# Patient Record
Sex: Female | Born: 1980 | Race: White | Hispanic: No | Marital: Married | Smoking: Never smoker
Health system: Southern US, Community
[De-identification: ages and names within clinical notes are randomized; demographics above are authoritative.]

## PROBLEM LIST (undated history)

## (undated) DIAGNOSIS — T8859XA Other complications of anesthesia, initial encounter: Secondary | ICD-10-CM

## (undated) DIAGNOSIS — T4145XA Adverse effect of unspecified anesthetic, initial encounter: Secondary | ICD-10-CM

## (undated) DIAGNOSIS — R7303 Prediabetes: Secondary | ICD-10-CM

## (undated) DIAGNOSIS — M549 Dorsalgia, unspecified: Secondary | ICD-10-CM

## (undated) DIAGNOSIS — F32A Depression, unspecified: Secondary | ICD-10-CM

## (undated) DIAGNOSIS — F329 Major depressive disorder, single episode, unspecified: Secondary | ICD-10-CM

## (undated) DIAGNOSIS — K760 Fatty (change of) liver, not elsewhere classified: Secondary | ICD-10-CM

## (undated) DIAGNOSIS — L409 Psoriasis, unspecified: Secondary | ICD-10-CM

## (undated) DIAGNOSIS — K5900 Constipation, unspecified: Secondary | ICD-10-CM

## (undated) DIAGNOSIS — F419 Anxiety disorder, unspecified: Secondary | ICD-10-CM

## (undated) HISTORY — DX: Constipation, unspecified: K59.00

## (undated) HISTORY — DX: Dorsalgia, unspecified: M54.9

## (undated) HISTORY — DX: Prediabetes: R73.03

## (undated) HISTORY — PX: WISDOM TOOTH EXTRACTION: SHX21

## (undated) HISTORY — DX: Psoriasis, unspecified: L40.9

## (undated) HISTORY — DX: Fatty (change of) liver, not elsewhere classified: K76.0

## (undated) HISTORY — DX: Anxiety disorder, unspecified: F41.9

---
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
## 2004-05-13 ENCOUNTER — Other Ambulatory Visit: Admission: RE | Admit: 2004-05-13 | Discharge: 2004-05-13 | Payer: Self-pay | Admitting: Emergency Medicine

## 2005-06-06 ENCOUNTER — Other Ambulatory Visit: Admission: RE | Admit: 2005-06-06 | Discharge: 2005-06-06 | Payer: Self-pay | Admitting: Family Medicine

## 2006-06-28 ENCOUNTER — Other Ambulatory Visit: Admission: RE | Admit: 2006-06-28 | Discharge: 2006-06-28 | Payer: Self-pay | Admitting: Family Medicine

## 2006-09-04 ENCOUNTER — Encounter: Admission: RE | Admit: 2006-09-04 | Discharge: 2006-09-04 | Payer: Self-pay | Admitting: Emergency Medicine

## 2007-09-03 ENCOUNTER — Other Ambulatory Visit: Admission: RE | Admit: 2007-09-03 | Discharge: 2007-09-03 | Payer: Self-pay | Admitting: Family Medicine

## 2009-01-28 ENCOUNTER — Ambulatory Visit (HOSPITAL_COMMUNITY): Admission: RE | Admit: 2009-01-28 | Discharge: 2009-01-28 | Payer: Self-pay | Admitting: Obstetrics & Gynecology

## 2009-02-25 ENCOUNTER — Ambulatory Visit (HOSPITAL_COMMUNITY): Admission: RE | Admit: 2009-02-25 | Discharge: 2009-02-25 | Payer: Self-pay | Admitting: Obstetrics & Gynecology

## 2009-05-10 ENCOUNTER — Ambulatory Visit (HOSPITAL_COMMUNITY): Admission: RE | Admit: 2009-05-10 | Discharge: 2009-05-10 | Payer: Self-pay | Admitting: Obstetrics and Gynecology

## 2009-05-23 ENCOUNTER — Inpatient Hospital Stay (HOSPITAL_COMMUNITY): Admission: AD | Admit: 2009-05-23 | Discharge: 2009-05-23 | Payer: Self-pay | Admitting: Obstetrics & Gynecology

## 2009-05-29 ENCOUNTER — Inpatient Hospital Stay (HOSPITAL_COMMUNITY): Admission: AD | Admit: 2009-05-29 | Discharge: 2009-05-29 | Payer: Self-pay | Admitting: Obstetrics and Gynecology

## 2009-06-09 ENCOUNTER — Inpatient Hospital Stay (HOSPITAL_COMMUNITY): Admission: RE | Admit: 2009-06-09 | Discharge: 2009-06-12 | Payer: Self-pay | Admitting: Obstetrics and Gynecology

## 2009-12-26 ENCOUNTER — Emergency Department (HOSPITAL_COMMUNITY): Admission: EM | Admit: 2009-12-26 | Discharge: 2009-12-26 | Payer: Self-pay | Admitting: Emergency Medicine

## 2010-03-04 ENCOUNTER — Emergency Department (HOSPITAL_COMMUNITY)
Admission: EM | Admit: 2010-03-04 | Discharge: 2010-03-04 | Payer: Self-pay | Source: Home / Self Care | Admitting: Emergency Medicine

## 2010-07-20 LAB — URINALYSIS, ROUTINE W REFLEX MICROSCOPIC
Bilirubin Urine: NEGATIVE
Ketones, ur: 15 mg/dL — AB
Nitrite: NEGATIVE
Urobilinogen, UA: 0.2 mg/dL (ref 0.0–1.0)
pH: 5.5 (ref 5.0–8.0)

## 2010-07-20 LAB — DIFFERENTIAL
Eosinophils Absolute: 0.2 10*3/uL (ref 0.0–0.7)
Eosinophils Relative: 2 % (ref 0–5)
Lymphocytes Relative: 6 % — ABNORMAL LOW (ref 12–46)
Lymphs Abs: 1 10*3/uL (ref 0.7–4.0)
Monocytes Absolute: 1 10*3/uL (ref 0.1–1.0)
Monocytes Relative: 7 % (ref 3–12)

## 2010-07-20 LAB — CBC
Hemoglobin: 12.8 g/dL (ref 12.0–15.0)
MCH: 28.9 pg (ref 26.0–34.0)
MCHC: 33.7 g/dL (ref 30.0–36.0)
Platelets: 220 10*3/uL (ref 150–400)
RDW: 12.5 % (ref 11.5–15.5)

## 2010-07-20 LAB — POCT CARDIAC MARKERS
CKMB, poc: <1 ng/mL — ABNORMAL LOW (ref 1.0–8.0)
CKMB, poc: <1 ng/mL — ABNORMAL LOW (ref 1.0–8.0)
Myoglobin, poc: 37.7 ng/mL (ref 12–200)
Myoglobin, poc: 44.7 ng/mL (ref 12–200)
Troponin i, poc: <0.05 ng/mL (ref 0.00–0.09)

## 2010-07-20 LAB — COMPREHENSIVE METABOLIC PANEL
ALT: 122 U/L — ABNORMAL HIGH (ref 0–35)
AST: 187 U/L — ABNORMAL HIGH (ref 0–37)
Albumin: 3.9 g/dL (ref 3.5–5.2)
CO2: 24 mEq/L (ref 19–32)
Calcium: 8.9 mg/dL (ref 8.4–10.5)
Creatinine, Ser: 0.58 mg/dL (ref 0.4–1.2)
GFR calc Af Amer: >60 mL/min (ref >60)
GFR calc non Af Amer: >60 mL/min (ref >60)
Sodium: 136 mEq/L (ref 135–145)
Total Protein: 7.7 g/dL (ref 6.0–8.3)

## 2010-07-20 LAB — LIPASE, BLOOD: Lipase: 23 U/L (ref 11–59)

## 2010-07-22 LAB — COMPREHENSIVE METABOLIC PANEL
BUN: 12 mg/dL (ref 6–23)
CO2: 25 mEq/L (ref 19–32)
Calcium: 8.3 mg/dL — ABNORMAL LOW (ref 8.4–10.5)
Chloride: 110 mEq/L (ref 96–112)
Creatinine, Ser: 0.58 mg/dL (ref 0.4–1.2)
GFR calc non Af Amer: >60 mL/min (ref >60)
Total Bilirubin: 0.6 mg/dL (ref 0.3–1.2)

## 2010-07-22 LAB — DIFFERENTIAL
Basophils Absolute: 0 10*3/uL (ref 0.0–0.1)
Lymphocytes Relative: 12 % (ref 12–46)
Lymphs Abs: 2.1 10*3/uL (ref 0.7–4.0)
Neutro Abs: 14 10*3/uL — ABNORMAL HIGH (ref 1.7–7.7)

## 2010-07-22 LAB — CBC
MCH: 28.9 pg (ref 26.0–34.0)
MCHC: 33.9 g/dL (ref 30.0–36.0)
MCV: 85.1 fL (ref 78.0–100.0)
Platelets: 213 10*3/uL (ref 150–400)
RBC: 3.95 MIL/uL (ref 3.87–5.11)

## 2010-07-22 LAB — POCT CARDIAC MARKERS
CKMB, poc: <1 ng/mL — ABNORMAL LOW (ref 1.0–8.0)
Myoglobin, poc: 9.8 ng/mL — ABNORMAL LOW (ref 12–200)

## 2010-07-22 LAB — LIPASE, BLOOD: Lipase: 24 U/L (ref 11–59)

## 2010-07-27 LAB — CBC
HCT: 29.7 % — ABNORMAL LOW (ref 36.0–46.0)
MCHC: 34 g/dL (ref 30.0–36.0)
MCHC: 34.3 g/dL (ref 30.0–36.0)
MCV: 89.7 fL (ref 78.0–100.0)
Platelets: 228 10*3/uL (ref 150–400)
RBC: 3.31 MIL/uL — ABNORMAL LOW (ref 3.87–5.11)
RDW: 13.4 % (ref 11.5–15.5)
WBC: 14.4 10*3/uL — ABNORMAL HIGH (ref 4.0–10.5)

## 2010-07-27 LAB — TYPE AND SCREEN: ABO/RH(D): O POS

## 2010-07-27 LAB — RPR: RPR Ser Ql: NONREACTIVE

## 2011-06-15 ENCOUNTER — Other Ambulatory Visit: Payer: Self-pay | Admitting: Family Medicine

## 2011-06-15 ENCOUNTER — Ambulatory Visit (INDEPENDENT_AMBULATORY_CARE_PROVIDER_SITE_OTHER): Payer: BC Managed Care – PPO | Admitting: Family Medicine

## 2011-06-15 DIAGNOSIS — R509 Fever, unspecified: Secondary | ICD-10-CM

## 2011-06-15 DIAGNOSIS — J029 Acute pharyngitis, unspecified: Secondary | ICD-10-CM

## 2011-06-15 LAB — POCT CBC
Granulocyte percent: 83.4 %G — AB (ref 37–80)
MID (cbc): 0.8 (ref 0–0.9)
MPV: 8.7 fL (ref 0–99.8)
POC Granulocyte: 15.7 — AB (ref 2–6.9)
POC LYMPH PERCENT: 12.1 %L (ref 10–50)
POC MID %: 4.5 %M (ref 0–12)
Platelet Count, POC: 284 10*3/uL (ref 142–424)
RBC: 4.65 M/uL (ref 4.04–5.48)
RDW, POC: 12.4 %

## 2011-06-15 MED ORDER — CEFDINIR 300 MG PO CAPS
300.0000 mg | ORAL_CAPSULE | Freq: Two times a day (BID) | ORAL | Status: AC
Start: 2011-06-15 — End: 2011-06-25

## 2011-06-15 MED ORDER — ACETAMINOPHEN 325 MG PO TABS
1000.0000 mg | ORAL_TABLET | Freq: Once | ORAL | Status: AC
  Administered 2011-06-15: 975 mg via ORAL

## 2011-06-15 NOTE — Progress Notes (Signed)
Patient Name: Tonya Olsen Date of Birth: Jan 08, 1981 Medical Record Number: [id number] Gender: female Date of Encounter: 06/15/2011  History of Present Illness:  Tonya Olsen is a 31 y.o. very pleasant female patient who presents with the following:  Severe sore throat which began yesterday.  Today had even worse ST, also URI symptoms, fever and chills.  No cough, and is able to eat and drink.  Has tried to push fluids and notes good urine output.   LMP- currently on menses.  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  Generally healthy.  Has felt dizzy and was concerned that she might be dehydrated.   There is no problem list on file for this patient.  No past medical history on file. No past surgical history on file. History  Substance Use Topics  . Smoking status: Never Smoker   . Smokeless tobacco: Not on file  . Alcohol Use: Not on file   No family history on file. No Known Allergies  Medication list has been reviewed and updated.  Review of Systems: As per HPI.  Notably negative for any GI symptoms or urinary symptoms  Physical Examination: Filed Vitals:   06/15/11 1849  BP: 127/85  Pulse: 144  Temp: 100 F (37.8 C)  TempSrc: Oral  Resp: 20  Height: 5\' 2"  (1.575 m)  Weight: 181 lb (82.101 kg)    Body mass index is 33.11 kg/(m^2).  GEN: WDWN, NAD, Non-toxic, A & O x 3 HEENT: Atraumatic, Normocephalic. Neck supple. No masses, No LAD. Ears and Nose: No external deformity. Tm wnl bilaterally- tonsils are large but not red/ no exudate.   CV: tachycardic, No M/G/R. No JVD. No thrill. No extra heart sounds. PULM: CTA B, no wheezes, crackles, rhonchi. No retractions. No resp. distress. No accessory muscle use. ABD: S, NT, ND, +BS. No rebound. No HSM.  Patient does have complaint of chronic back problems which make it difficult for her to change position for abd exam.  EXTR: No c/c/e NEURO Normal gait.  PSYCH: Normally  interactive. Conversant. Not depressed or anxious appearing.  Calm demeanor.  Results for orders placed in visit on 06/15/11  POCT RAPID STREP A (OFFICE)      Component Value Range   Rapid Strep A Screen Negative  Negative   POCT CBC      Component Value Range   WBC 18.8 (*) 4.6 - 10.2 (K/uL)   Lymph, poc 2.3  0.6 - 3.4    POC LYMPH PERCENT 12.1  10 - 50 (%L)   MID (cbc) 0.8  0 - 0.9    POC MID % 4.5  0 - 12 (%M)   POC Granulocyte 15.7 (*) 2 - 6.9    Granulocyte percent 83.4 (*) 37 - 80 (%G)   RBC 4.65  4.04 - 5.48 (M/uL)   Hemoglobin 13.0  12.2 - 16.2 (g/dL)   HCT, POC 96.2  95.2 - 47.9 (%)   MCV 86.1  80 - 97 (fL)   MCH, POC 28.0  27 - 31.2 (pg)   MCHC 32.5  31.8 - 35.4 (g/dL)   RDW, POC 84.1     Platelet Count, POC 284  142 - 424 (K/uL)   MPV 8.7  0 - 99.8 (fL)   Patient felt better and pulse came down to within normal limits with IVF.  Measured at 96 BPM.  Temp to 97.8.  Patient took 1 and 2/3 bags of normal  saline and felt better, dizziness resolved.  She desired to go home.   Assessment and Plan: 1. Fever, unspecified  acetaminophen (TYLENOL) tablet 975 mg, POCT CBC  2. Sore throat  POCT rapid strep A, cefdinir (OMNICEF) 300 MG capsule   Pharyngitis and dehydration.  Treated with IVF and antipyretics as above.  Called omnief to phamacy in case of bacterial origin of sore throat.  Urged lots of fluids, bland diet, antipyretics.  Patient (or parent if minor) instructed to return to clinic or call if not better in 2 day(s).Sooner if worse- go to ED if severe symptoms such as syncope or pre-syncope overnight.

## 2011-06-19 LAB — CULTURE, GROUP A STREP: Organism ID, Bacteria: NORMAL

## 2011-06-24 ENCOUNTER — Telehealth: Payer: Self-pay

## 2011-06-24 NOTE — Telephone Encounter (Signed)
LMOM to CB. 

## 2011-06-24 NOTE — Telephone Encounter (Signed)
Pt is no better - saw Dr. Patsy Lager few days ago  Call [phone]

## 2011-06-24 NOTE — Telephone Encounter (Signed)
However, if she is better except for PND we could call in some atrovent nasal spray

## 2011-06-24 NOTE — Telephone Encounter (Signed)
Tonya Olsen was quite ill last when we saw her last and had to have IV fluids!  If her throat is still not doing well she needs to please come in for a recheck.

## 2011-06-24 NOTE — Telephone Encounter (Signed)
Spoke with pt and she states her throat is more sore than last week and she is taking tylenol with no relief. She is now having body aches, no fever, and headache is gone. She thinks the PND is causing her throat to be so sore. What should I advise pt? [facility] [address].

## 2011-06-25 ENCOUNTER — Ambulatory Visit (INDEPENDENT_AMBULATORY_CARE_PROVIDER_SITE_OTHER): Payer: BC Managed Care – PPO | Admitting: Family Medicine

## 2011-06-25 ENCOUNTER — Encounter: Payer: Self-pay | Admitting: Physician Assistant

## 2011-06-25 VITALS — BP 100/66 | HR 89 | Temp 98.2°F | Resp 16 | Ht 62.0 in | Wt 183.0 lb

## 2011-06-25 DIAGNOSIS — R5383 Other fatigue: Secondary | ICD-10-CM

## 2011-06-25 DIAGNOSIS — D7289 Other specified disorders of white blood cells: Secondary | ICD-10-CM

## 2011-06-25 DIAGNOSIS — R5381 Other malaise: Secondary | ICD-10-CM

## 2011-06-25 DIAGNOSIS — J029 Acute pharyngitis, unspecified: Secondary | ICD-10-CM

## 2011-06-25 LAB — POCT CBC
Granulocyte percent: 63.6 %G (ref 37–80)
HCT, POC: 40 % (ref 37.7–47.9)
Hemoglobin: 12.9 g/dL (ref 12.2–16.2)
POC Granulocyte: 7.8 — AB (ref 2–6.9)
POC LYMPH PERCENT: 30.8 %L (ref 10–50)
RBC: 4.64 M/uL (ref 4.04–5.48)
RDW, POC: 12.3 %

## 2011-06-25 LAB — COMPREHENSIVE METABOLIC PANEL
AST: 22 U/L (ref 0–37)
Albumin: 4.7 g/dL (ref 3.5–5.2)
Alkaline Phosphatase: 84 U/L (ref 39–117)
Chloride: 102 mEq/L (ref 96–112)
Glucose, Bld: 82 mg/dL (ref 70–99)
Potassium: 4.5 mEq/L (ref 3.5–5.3)
Sodium: 138 mEq/L (ref 135–145)
Total Protein: 7.8 g/dL (ref 6.0–8.3)

## 2011-06-25 MED ORDER — DIPHENHYD-HYDROCORT-NYSTATIN MT SUSP: 5.0000 mL | Freq: Four times a day (QID) | OROMUCOSAL | Status: DC | PRN

## 2011-06-25 MED ORDER — PREDNISONE 20 MG PO TABS: ORAL_TABLET | ORAL | Status: AC

## 2011-06-25 NOTE — Telephone Encounter (Signed)
Spoke with pt advised to RTC. Pt will f/u today

## 2011-06-25 NOTE — Progress Notes (Signed)
Patient ID: Tonya Olsen MRN: [id number], DOB: 1980-07-17, 31 y.o. Date of Encounter: 06/25/2011, 11:14 AM  Primary Physician: No primary provider on file.  Chief Complaint:  Chief Complaint  Patient presents with  . Sinusitis  . Sore Throat    HPI: 31 y.o. year old female presents with continued sore throat from last OV on 06/15/11. At that visit she was found to have a WBC count of 18.8  And temp of 100.0,  treated with Omnicef for pharyngitis. She states this did help her sore throat to improve, until 3 days prior when her sore throat returned, although not as bad as initial presentation.  Currently she afebrile. No cough. No nasal congestion. Normal hearing. No sinus pressure. Decreased appetite secondary to sore throat. No GI symptoms.   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 Generally healthy.  No leg trauma, sedentary periods, h/o cancer, or tobacco use.  No past medical history on file.   Home Meds: Prior to Admission medications   Medication Sig Start Date End Date Taking? Authorizing Provider  cefdinir (OMNICEF) 300 MG capsule Take 1 capsule (300 mg total) by mouth 2 (two) times daily. 06/15/11 06/25/11  Abbe Amsterdam, MD    Allergies: No Known Allergies  History   Social History  . Marital Status: Married    Spouse Name: N/A    Number of Children: N/A  . Years of Education: N/A   Occupational History  . Not on file.   Social History Main Topics  . Smoking status: Never Smoker   . Smokeless tobacco: Not on file  . Alcohol Use: Not on file  . Drug Use: Not on file  . Sexually Active: Not on file   Other Topics Concern  . Not on file   Social History Narrative  . No narrative on file     Review of Systems: Constitutional: negative for chills, fever, night sweats or weight changes Cardiovascular: negative for chest pain or palpitations Respiratory: negative for hemoptysis, wheezing, or shortness of breath Abdominal:  negative for abdominal pain, nausea, vomiting or diarrhea Dermatological: negative for rash Neurologic: negative for headache   Physical Exam: Blood pressure 100/66, pulse 89, temperature 98.2 F (36.8 C), temperature source Oral, resp. rate 16, height 5\' 2"  (1.575 m), weight 183 lb (83.008 kg), last menstrual period 06/11/2011., Body mass index is 33.47 kg/(m^2). General: Well developed, well nourished, in no acute distress. Head: Normocephalic, atraumatic, eyes without discharge, sclera non-icteric, nares are congested. Bilateral auditory canals clear, TM's are without perforation, pearly grey with reflective cone of light bilaterally. Oral cavity moist, dentition normal. Posterior pharynx with post nasal drip and mild erythema. Bilateral tonsil 3+ (patient states is baseline for her). No peritonsillar abscess or tonsillar exudate. Neck: Supple. No thyromegaly. Full ROM. <2 cm AC Lungs: Clear bilaterally to auscultation without wheezes, rales, or rhonchi. Breathing is unlabored. Heart: RRR with S1 S2. No murmurs, rubs, or gallops appreciated. Abdomen: Soft, non-tender, non-distended with normoactive bowel sounds. No hepatosplenomegaly. No rebound/guarding. No obvious abdominal masses. Msk:  Strength and tone normal for age. Extremities: No clubbing or cyanosis. No edema. Neuro: Alert and oriented X 3. Moves all extremities spontaneously. CNII-XII grossly in tact. Psych:  Responds to questions appropriately with a normal affect.   Labs: Results for orders placed in visit on 06/25/11  POCT RAPID STREP A (OFFICE)      Component Value Range   Rapid Strep A Screen Negative  Negative   POCT  CBC      Component Value Range   WBC 12.3 (*) 4.6 - 10.2 (K/uL)   Lymph, poc 3.8 (*) 0.6 - 3.4    POC LYMPH PERCENT 30.8  10 - 50 (%L)   MID (cbc) 0.7  0 - 0.9    POC MID % 5.6  0 - 12 (%M)   POC Granulocyte 7.8 (*) 2 - 6.9    Granulocyte percent 63.6  37 - 80 (%G)   RBC 4.64  4.04 - 5.48 (M/uL)    Hemoglobin 12.9  12.2 - 16.2 (g/dL)   HCT, POC 16.1  09.6 - 47.9 (%)   MCV 89.1  80 - 97 (fL)   MCH, POC 27.8  27 - 31.2 (pg)   MCHC 32.3  31.8 - 35.4 (g/dL)   RDW, POC 04.5     Platelet Count, POC 353  142 - 424 (K/uL)   MPV 7.8  0 - 99.8 (fL)    Throat culture, EBV/CMV titers, and CMP pending  ASSESSMENT AND PLAN:  31 y.o. year old female with resolving pharyngitis/leukocytosis. Consider CMV/EBV as etiology. -Finish Omnicef -Supportive care -Add Prednisone 20 mg #12 3x2, 2x2, 1x2 no RF SED -[employer] Magic Mouthwash -Mucinex -Tylenol/Motrin prn -Rest/fluids -RTC precautions -RTC 3-5 days if no improvement -D/w Dr. Hal Hope  Signed, Eula Listen, PA-C 06/25/2011 11:14 AM

## 2011-06-25 NOTE — Progress Notes (Deleted)
  Subjective:    Patient ID: Tonya Olsen, female    DOB: 05/24/1980, 31 y.o.   MRN: [id number]  HPI    Review of Systems     Objective:   Physical Exam        Assessment & Plan:

## 2011-06-26 LAB — CYTOMEGALOVIRUS ANTIBODY, IGG: Cytomegalovirus Ab-IgG: 2.36 — ABNORMAL HIGH (ref <0.90)

## 2011-06-26 LAB — EPSTEIN-BARR VIRUS VCA ANTIBODY PANEL: EBV VCA IgM: 0.18 {ISR}

## 2011-06-27 LAB — CULTURE, GROUP A STREP: Organism ID, Bacteria: NORMAL

## 2011-06-27 NOTE — Progress Notes (Signed)
  Subjective:    Patient ID: Tonya Olsen, female    DOB: 12/07/1980, 30 y.o.   MRN: [id number]  HPI    Review of Systems     Objective:   Physical Exam        Assessment & Plan:   

## 2011-07-02 ENCOUNTER — Telehealth: Payer: Self-pay

## 2011-07-02 MED ORDER — MOMETASONE FUROATE 50 MCG/ACT NA SUSP: 2.0000 | Freq: Every day | NASAL | Status: DC

## 2011-07-02 NOTE — Telephone Encounter (Signed)
LMOM to CB. 

## 2011-07-02 NOTE — Telephone Encounter (Signed)
Nasonex sent to her pharmacy, hope this helps!

## 2011-07-02 NOTE — Telephone Encounter (Signed)
Spoke with patient, would like to try nasal spray.  [facility] into [facility].  If this doesn't help, patient will RTC.

## 2011-07-02 NOTE — Telephone Encounter (Signed)
Spoke with patient, still having ST and nasal drainage.  Pt is able to breathe normally, but producing a lot of mucus.  Has been taking meds as prescribed, and using Claritin.  Can we recommend something else to help?!  Patient frustrated with ongoing symptoms, but doesn't use nasal sprays due to gag reflex.  She would like to know if MD recommends referral to ENT at this point?  Pls advise.

## 2011-07-02 NOTE — Telephone Encounter (Signed)
I realize this has been several weeks but we would need to see her back to recheck her blood work and do an exam before we could complete a referral to ENT or other provider (last seen a week ago).  Nasal sprays are recommended for her symptoms, would she like to try one?

## 2011-07-02 NOTE — Telephone Encounter (Signed)
Pt is states that she is not any better and would like for someone to give her a call back.

## 2011-07-03 NOTE — Telephone Encounter (Signed)
LMOM RX sent in to pharmacy. 

## 2012-02-12 ENCOUNTER — Ambulatory Visit (INDEPENDENT_AMBULATORY_CARE_PROVIDER_SITE_OTHER): Payer: BC Managed Care – PPO | Admitting: Family Medicine

## 2012-02-12 VITALS — BP 95/60 | HR 114 | Temp 98.9°F | Resp 16 | Ht 62.75 in | Wt 192.0 lb

## 2012-02-12 DIAGNOSIS — S40021A Contusion of right upper arm, initial encounter: Secondary | ICD-10-CM

## 2012-02-12 DIAGNOSIS — J329 Chronic sinusitis, unspecified: Secondary | ICD-10-CM

## 2012-02-12 DIAGNOSIS — S40029A Contusion of unspecified upper arm, initial encounter: Secondary | ICD-10-CM

## 2012-02-12 MED ORDER — AZITHROMYCIN 250 MG PO TABS: ORAL_TABLET | ORAL | Status: DC

## 2012-02-12 MED ORDER — KETOPROFEN 50 MG PO CAPS: 50.0000 mg | ORAL_CAPSULE | Freq: Three times a day (TID) | ORAL | Status: DC | PRN

## 2012-02-12 NOTE — Patient Instructions (Signed)

## 2012-02-12 NOTE — Progress Notes (Signed)
@[email or web address] @   Patient ID: Tonya Olsen MRN: [id number], DOB: 05-30-1980, 31 y.o. Date of Encounter: 02/12/2012, 2:41 PM  Primary Physician: No primary provider on file.  Chief Complaint:  Chief Complaint  Patient presents with  . Nausea    fever, sorethroat, light headed x today at 10am  . Arm Injury    right arm pain after injury last week    HPI: 31 y.o. year old female presents with 4 day history of nasal congestion, post nasal drip, sore throat, sinus pressure, and cough. Developed fever and sorethroat over past day or so.  No chills. Minimal nasal drainage.  Sinus pressure is the worst symptom. Cough is productive secondary to post nasal drip and not associated with time of day. Ears feel full, leading to sensation of muffled hearing. Has tried OTC cold preps without success. No GI complaints.   No recent antibiotics, recent travels, or sick contacts   No leg trauma, sedentary periods, h/o cancer, or tobacco use.  No past medical history on file.   Home Meds: Prior to Admission medications   Medication Sig Start Date End Date Taking? Authorizing Provider  mometasone (NASONEX) 50 MCG/ACT nasal spray Place 2 sprays into the nose daily. 07/02/11 07/01/12 Yes Marmolejos Patience, PA-C  Diphenhyd-Hydrocort-Nystatin SUSP Use as directed 5 mLs in the mouth or throat 4 (four) times daily as needed. 06/25/11   Sondra Barges, PA-C    Allergies: No Known Allergies  History   Social History  . Marital Status: Married    Spouse Name: N/A    Number of Children: N/A  . Years of Education: N/A   Occupational History  . Not on file.   Social History Main Topics  . Smoking status: Never Smoker   . Smokeless tobacco: Not on file  . Alcohol Use: Not on file  . Drug Use: Not on file  . Sexually Active: Not on file   Other Topics Concern  . Not on file   Social History Narrative  . No narrative on file     Review of Systems: Constitutional: negative for chills, night sweats or weight  changes Cardiovascular: negative for chest pain or palpitations Respiratory: negative for hemoptysis, wheezing, or shortness of breath Abdominal: negative for abdominal pain, nausea, vomiting or diarrhea Dermatological: negative for rash Neurologic: negative for headache   Physical Exam: Blood pressure 95/60, pulse 114, temperature 98.9 F (37.2 C), resp. rate 16, height 5' 2.75" (1.594 m), weight 192 lb (87.091 kg), last menstrual period 02/10/2012, SpO2 99.00%., Body mass index is 34.28 kg/(m^2). General: Well developed, well nourished, in no acute distress. Head: Normocephalic, atraumatic, eyes without discharge, sclera non-icteric, nares are congested. Bilateral auditory canals clear, TM's are without perforation, pearly grey with reflective cone of light bilaterally. Serous effusion bilaterally behind TM's. Maxillary sinus TTP. Oral cavity moist, dentition normal. Posterior pharynx with post nasal drip and mild erythema. No peritonsillar abscess or tonsillar exudate. Neck: Supple. No thyromegaly. Full ROM. No lymphadenopathy. Lungs: Clear bilaterally to auscultation without wheezes, rales, or rhonchi. Breathing is unlabored.  Heart: RRR with S1 S2. No murmurs, rubs, or gallops appreciated. Msk:  Strength and tone normal for age. Extremities: No clubbing or cyanosis. No edema. Neuro: Alert and oriented X 3. Moves all extremities spontaneously. CNII-XII grossly in tact. Psych:  Responds to questions appropriately with a normal affect.     ASSESSMENT AND PLAN:  31 y.o. year old female with sinusitis -  -Tylenol/Motrin prn -Rest/fluids -RTC precautions -RTC 3-5  days if no improvement  Signed, Elvina Sidle, MD 02/12/2012 2:41 PM  Also complains of right arm pain after striking ulnar side on metal frame in the car 9 days ago.  Pain worse when lifting heavy objects.  Objective:  Right forearm and elbow  Inspection: normal  Palpation:  Normal  ROM:  Normal  Assessment:   Suspect nerve contusion  Plan:  ketoprofen

## 2012-02-13 ENCOUNTER — Telehealth: Payer: Self-pay

## 2012-02-13 NOTE — Telephone Encounter (Signed)
Ok for out of work note

## 2012-02-13 NOTE — Telephone Encounter (Signed)
PT WOULD LIKE AN ADDITIONAL DAY ON HER OUT OF WORK NOTE PLEASE CALL HER AND TELL HER IF WE CAN DO THIS FOR HER AT [phone]

## 2012-02-14 NOTE — Telephone Encounter (Signed)
Pt reports she has not turned in the orig note yet, so if we can just write a new one including excusing her for today and returning to work tomorrow and mail it to her she would appreciate it. Verified correct address w/pt and printed/mailed new [facility] note.

## 2013-03-04 ENCOUNTER — Other Ambulatory Visit: Payer: Self-pay

## 2013-03-05 ENCOUNTER — Ambulatory Visit (HOSPITAL_COMMUNITY)
Admission: RE | Admit: 2013-03-05 | Discharge: 2013-03-05 | Disposition: A | Discharge status: Home / Self Care | Payer: BC Managed Care – PPO | Source: Ambulatory Visit | Attending: Obstetrics and Gynecology | Admitting: Obstetrics and Gynecology

## 2013-03-05 ENCOUNTER — Other Ambulatory Visit (HOSPITAL_COMMUNITY): Payer: Self-pay | Admitting: Obstetrics and Gynecology

## 2013-03-05 DIAGNOSIS — O283 Abnormal ultrasonic finding on antenatal screening of mother: Secondary | ICD-10-CM

## 2013-03-05 DIAGNOSIS — O352XX0 Maternal care for (suspected) hereditary disease in fetus, not applicable or unspecified: Secondary | ICD-10-CM | POA: Insufficient documentation

## 2013-03-05 DIAGNOSIS — O34219 Maternal care for unspecified type scar from previous cesarean delivery: Secondary | ICD-10-CM | POA: Insufficient documentation

## 2013-03-05 DIAGNOSIS — O358XX0 Maternal care for other (suspected) fetal abnormality and damage, not applicable or unspecified: Secondary | ICD-10-CM | POA: Insufficient documentation

## 2013-03-05 DIAGNOSIS — Z1389 Encounter for screening for other disorder: Secondary | ICD-10-CM | POA: Insufficient documentation

## 2013-03-05 DIAGNOSIS — O337XX0 Maternal care for disproportion due to other fetal deformities, not applicable or unspecified: Secondary | ICD-10-CM | POA: Insufficient documentation

## 2013-03-05 DIAGNOSIS — Z363 Encounter for antenatal screening for malformations: Secondary | ICD-10-CM | POA: Insufficient documentation

## 2013-03-05 NOTE — Consult Note (Signed)
Maternal Fetal Medicine Consultation  Requesting Provider(s): Waynard Reeds, MD  Reason for consultation: Fetal chest mass  HPI: Tonya Olsen is a 32 yo [id number] currently at 17 5/7 weeks who was seen today for consultation due to a large fetal chest mass that was recently noted on ultrasound.  Her prenatal course has been complicated by nausea and vomiting requiring antiemetics.  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  The stomach was never definitively visualized and cannot rule out congenital diaphragmatic hernia, but feel that this is much less likely.  No other fetal anomalies were detected, although limited views of the fetal heart were obtained due to mediastinal shift.  The findings and limitations of the study were discussed with the patient and the differential diagnosis was discussed.  The CVR was calculated as 1.28 which places the fetus at an intermediate risk for fetal hydrops.  She will require close/ weekly follow up to rule out developing fetal hydrops.  We will try to arrange an early consultation with Peds surgery - the couple is aware that this is a serious abnormality and had some concerns about the child's long-term prognosis which I feel they would be in a much better position to address.  Based on the findings, there may be a  role for an early course of betamethasone which has been reported to slow the growth the CPAM (usually indicated for a CVR > 1.4).  She will require delivery at a Level III center with Peds surgery availability.  Recommendations: 1) Weekly follow up with MFM to rule out developing hydrops 2) If stomach bubble is not visualized on follow up ultrasounds, may consider fetal MRI in 3rd trimester to definitively rule out [facility] 3) Peds surgery consultation - we will try to get this expedited 4) Fetal echo - scheduled.   Thank you for the opportunity to be a part of the care of Tonya Olsen. Please contact our office if we can be of further assistance.   I spent approximately 30 minutes with this patient with over 50% of time spent in face-to-face counseling.  Alpha Gula, MD Maternal Fetal Medicine

## 2013-03-12 ENCOUNTER — Ambulatory Visit (HOSPITAL_COMMUNITY)
Admission: RE | Admit: 2013-03-12 | Discharge: 2013-03-12 | Disposition: A | Discharge status: Home / Self Care | Payer: BC Managed Care – PPO | Source: Ambulatory Visit | Attending: Obstetrics and Gynecology | Admitting: Obstetrics and Gynecology

## 2013-03-12 ENCOUNTER — Encounter (HOSPITAL_COMMUNITY): Payer: Self-pay

## 2013-03-12 DIAGNOSIS — O34219 Maternal care for unspecified type scar from previous cesarean delivery: Secondary | ICD-10-CM | POA: Insufficient documentation

## 2013-03-12 DIAGNOSIS — O337XX0 Maternal care for disproportion due to other fetal deformities, not applicable or unspecified: Secondary | ICD-10-CM | POA: Insufficient documentation

## 2013-03-12 DIAGNOSIS — O358XX0 Maternal care for other (suspected) fetal abnormality and damage, not applicable or unspecified: Secondary | ICD-10-CM | POA: Insufficient documentation

## 2013-03-12 DIAGNOSIS — O352XX0 Maternal care for (suspected) hereditary disease in fetus, not applicable or unspecified: Secondary | ICD-10-CM | POA: Insufficient documentation

## 2013-03-19 ENCOUNTER — Ambulatory Visit (HOSPITAL_COMMUNITY)
Admission: RE | Admit: 2013-03-19 | Discharge: 2013-03-19 | Disposition: A | Discharge status: Home / Self Care | Payer: BC Managed Care – PPO | Source: Ambulatory Visit | Attending: Obstetrics and Gynecology | Admitting: Obstetrics and Gynecology

## 2013-03-19 ENCOUNTER — Encounter (HOSPITAL_COMMUNITY): Payer: Self-pay

## 2013-03-19 DIAGNOSIS — O358XX0 Maternal care for other (suspected) fetal abnormality and damage, not applicable or unspecified: Secondary | ICD-10-CM

## 2013-03-19 DIAGNOSIS — O352XX0 Maternal care for (suspected) hereditary disease in fetus, not applicable or unspecified: Secondary | ICD-10-CM | POA: Insufficient documentation

## 2013-03-19 DIAGNOSIS — O337XX0 Maternal care for disproportion due to other fetal deformities, not applicable or unspecified: Secondary | ICD-10-CM | POA: Insufficient documentation

## 2013-03-19 DIAGNOSIS — O34219 Maternal care for unspecified type scar from previous cesarean delivery: Secondary | ICD-10-CM | POA: Insufficient documentation

## 2013-03-20 ENCOUNTER — Ambulatory Visit (HOSPITAL_COMMUNITY)
Admission: RE | Admit: 2013-03-20 | Discharge: 2013-03-20 | Disposition: A | Discharge status: Home / Self Care | Payer: BC Managed Care – PPO | Source: Ambulatory Visit | Attending: Obstetrics and Gynecology | Admitting: Obstetrics and Gynecology

## 2013-03-20 DIAGNOSIS — O337XX0 Maternal care for disproportion due to other fetal deformities, not applicable or unspecified: Secondary | ICD-10-CM | POA: Insufficient documentation

## 2013-03-20 DIAGNOSIS — O358XX0 Maternal care for other (suspected) fetal abnormality and damage, not applicable or unspecified: Secondary | ICD-10-CM | POA: Insufficient documentation

## 2013-03-20 DIAGNOSIS — O34219 Maternal care for unspecified type scar from previous cesarean delivery: Secondary | ICD-10-CM | POA: Insufficient documentation

## 2013-03-20 DIAGNOSIS — O352XX0 Maternal care for (suspected) hereditary disease in fetus, not applicable or unspecified: Secondary | ICD-10-CM | POA: Insufficient documentation

## 2013-03-20 MED ORDER — BETAMETHASONE SOD PHOS & ACET 6 (3-3) MG/ML IJ SUSP
12.0000 mg | Freq: Once | INTRAMUSCULAR | Status: AC
  Administered 2013-03-20: 12 mg via INTRAMUSCULAR
  Filled 2013-03-20: qty 2

## 2013-03-21 ENCOUNTER — Ambulatory Visit (HOSPITAL_COMMUNITY)
Admission: RE | Admit: 2013-03-21 | Discharge: 2013-03-21 | Disposition: A | Discharge status: Home / Self Care | Payer: BC Managed Care – PPO | Source: Ambulatory Visit | Attending: Obstetrics and Gynecology | Admitting: Obstetrics and Gynecology

## 2013-03-21 DIAGNOSIS — O352XX0 Maternal care for (suspected) hereditary disease in fetus, not applicable or unspecified: Secondary | ICD-10-CM | POA: Insufficient documentation

## 2013-03-21 DIAGNOSIS — O337XX0 Maternal care for disproportion due to other fetal deformities, not applicable or unspecified: Secondary | ICD-10-CM | POA: Insufficient documentation

## 2013-03-21 DIAGNOSIS — O358XX0 Maternal care for other (suspected) fetal abnormality and damage, not applicable or unspecified: Secondary | ICD-10-CM | POA: Insufficient documentation

## 2013-03-21 DIAGNOSIS — O34219 Maternal care for unspecified type scar from previous cesarean delivery: Secondary | ICD-10-CM | POA: Insufficient documentation

## 2013-03-21 MED ORDER — BETAMETHASONE SOD PHOS & ACET 6 (3-3) MG/ML IJ SUSP
12.0000 mg | Freq: Once | INTRAMUSCULAR | Status: AC
  Administered 2013-03-21: 12 mg via INTRAMUSCULAR
  Filled 2013-03-21: qty 2

## 2013-03-26 ENCOUNTER — Ambulatory Visit (HOSPITAL_COMMUNITY): Payer: BC Managed Care – PPO

## 2013-03-31 ENCOUNTER — Other Ambulatory Visit (HOSPITAL_COMMUNITY): Payer: Self-pay | Admitting: Maternal and Fetal Medicine

## 2013-03-31 ENCOUNTER — Ambulatory Visit (HOSPITAL_COMMUNITY)
Admission: RE | Admit: 2013-03-31 | Discharge: 2013-03-31 | Disposition: A | Discharge status: Home / Self Care | Payer: BC Managed Care – PPO | Source: Ambulatory Visit | Attending: Maternal and Fetal Medicine | Admitting: Maternal and Fetal Medicine

## 2013-03-31 ENCOUNTER — Ambulatory Visit (HOSPITAL_COMMUNITY): Payer: BC Managed Care – PPO

## 2013-03-31 VITALS — BP 118/80 | HR 119

## 2013-03-31 DIAGNOSIS — O34219 Maternal care for unspecified type scar from previous cesarean delivery: Secondary | ICD-10-CM | POA: Insufficient documentation

## 2013-03-31 DIAGNOSIS — O358XX0 Maternal care for other (suspected) fetal abnormality and damage, not applicable or unspecified: Secondary | ICD-10-CM

## 2013-03-31 DIAGNOSIS — O352XX0 Maternal care for (suspected) hereditary disease in fetus, not applicable or unspecified: Secondary | ICD-10-CM | POA: Insufficient documentation

## 2013-03-31 DIAGNOSIS — O337XX0 Maternal care for disproportion due to other fetal deformities, not applicable or unspecified: Secondary | ICD-10-CM | POA: Insufficient documentation

## 2013-03-31 NOTE — Progress Notes (Signed)
Tonya Olsen  was seen today for an ultrasound appointment.  See full report in [facility].  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 No other anomalies noted No evidence of fetal hydrops Normal amniotic fluid volume  Patient was seen and counseled by Peds surgery last week.  Recommendations: Continue weekly ultrasounds to rule out hydrops Follow up ultrasound for interval growth in 2-3 weeks. Alpha Gula, MD

## 2013-04-01 NOTE — Addendum Note (Signed)
Encounter addended by: Alessandra Bevels. Chase Picket, RN on: 04/01/2013  3:45 PM<BR>     Documentation filed: Letters

## 2013-04-02 ENCOUNTER — Ambulatory Visit (HOSPITAL_COMMUNITY): Payer: BC Managed Care – PPO

## 2013-04-09 ENCOUNTER — Ambulatory Visit (HOSPITAL_COMMUNITY)
Admission: RE | Admit: 2013-04-09 | Discharge: 2013-04-09 | Disposition: A | Discharge status: Home / Self Care | Payer: BC Managed Care – PPO | Source: Ambulatory Visit | Attending: Obstetrics and Gynecology | Admitting: Obstetrics and Gynecology

## 2013-04-09 ENCOUNTER — Ambulatory Visit (HOSPITAL_COMMUNITY): Payer: BC Managed Care – PPO

## 2013-04-09 VITALS — BP 117/74 | HR 108

## 2013-04-09 DIAGNOSIS — O34219 Maternal care for unspecified type scar from previous cesarean delivery: Secondary | ICD-10-CM | POA: Insufficient documentation

## 2013-04-09 DIAGNOSIS — O358XX0 Maternal care for other (suspected) fetal abnormality and damage, not applicable or unspecified: Secondary | ICD-10-CM | POA: Insufficient documentation

## 2013-04-09 DIAGNOSIS — O352XX0 Maternal care for (suspected) hereditary disease in fetus, not applicable or unspecified: Secondary | ICD-10-CM | POA: Insufficient documentation

## 2013-04-09 DIAGNOSIS — O337XX0 Maternal care for disproportion due to other fetal deformities, not applicable or unspecified: Secondary | ICD-10-CM | POA: Insufficient documentation

## 2013-04-15 ENCOUNTER — Other Ambulatory Visit (HOSPITAL_COMMUNITY): Payer: Self-pay | Admitting: Maternal and Fetal Medicine

## 2013-04-15 DIAGNOSIS — O358XX0 Maternal care for other (suspected) fetal abnormality and damage, not applicable or unspecified: Secondary | ICD-10-CM

## 2013-04-16 ENCOUNTER — Ambulatory Visit (HOSPITAL_COMMUNITY): Payer: BC Managed Care – PPO

## 2013-04-16 ENCOUNTER — Ambulatory Visit (HOSPITAL_COMMUNITY)
Admission: RE | Admit: 2013-04-16 | Discharge: 2013-04-16 | Disposition: A | Discharge status: Home / Self Care | Payer: BC Managed Care – PPO | Source: Ambulatory Visit | Attending: Obstetrics and Gynecology | Admitting: Obstetrics and Gynecology

## 2013-04-16 DIAGNOSIS — O337XX0 Maternal care for disproportion due to other fetal deformities, not applicable or unspecified: Secondary | ICD-10-CM | POA: Insufficient documentation

## 2013-04-16 DIAGNOSIS — O358XX0 Maternal care for other (suspected) fetal abnormality and damage, not applicable or unspecified: Secondary | ICD-10-CM | POA: Insufficient documentation

## 2013-04-16 DIAGNOSIS — O34219 Maternal care for unspecified type scar from previous cesarean delivery: Secondary | ICD-10-CM | POA: Insufficient documentation

## 2013-04-16 DIAGNOSIS — O352XX0 Maternal care for (suspected) hereditary disease in fetus, not applicable or unspecified: Secondary | ICD-10-CM | POA: Insufficient documentation

## 2013-04-23 ENCOUNTER — Ambulatory Visit (HOSPITAL_COMMUNITY)
Admission: RE | Admit: 2013-04-23 | Discharge: 2013-04-23 | Disposition: A | Discharge status: Home / Self Care | Payer: BC Managed Care – PPO | Source: Ambulatory Visit | Attending: Obstetrics and Gynecology | Admitting: Obstetrics and Gynecology

## 2013-04-23 DIAGNOSIS — O358XX0 Maternal care for other (suspected) fetal abnormality and damage, not applicable or unspecified: Secondary | ICD-10-CM | POA: Insufficient documentation

## 2013-04-23 DIAGNOSIS — O34219 Maternal care for unspecified type scar from previous cesarean delivery: Secondary | ICD-10-CM | POA: Insufficient documentation

## 2013-04-23 DIAGNOSIS — O352XX0 Maternal care for (suspected) hereditary disease in fetus, not applicable or unspecified: Secondary | ICD-10-CM | POA: Insufficient documentation

## 2013-04-23 DIAGNOSIS — O337XX0 Maternal care for disproportion due to other fetal deformities, not applicable or unspecified: Secondary | ICD-10-CM | POA: Insufficient documentation

## 2013-04-29 ENCOUNTER — Ambulatory Visit (HOSPITAL_COMMUNITY)
Admission: RE | Admit: 2013-04-29 | Discharge: 2013-04-29 | Disposition: A | Discharge status: Home / Self Care | Payer: BC Managed Care – PPO | Source: Ambulatory Visit | Attending: Obstetrics and Gynecology | Admitting: Obstetrics and Gynecology

## 2013-04-29 ENCOUNTER — Other Ambulatory Visit (HOSPITAL_COMMUNITY): Payer: Self-pay | Admitting: Maternal and Fetal Medicine

## 2013-04-29 DIAGNOSIS — O337XX0 Maternal care for disproportion due to other fetal deformities, not applicable or unspecified: Secondary | ICD-10-CM | POA: Insufficient documentation

## 2013-04-29 DIAGNOSIS — O352XX0 Maternal care for (suspected) hereditary disease in fetus, not applicable or unspecified: Secondary | ICD-10-CM | POA: Insufficient documentation

## 2013-04-29 DIAGNOSIS — O358XX0 Maternal care for other (suspected) fetal abnormality and damage, not applicable or unspecified: Secondary | ICD-10-CM

## 2013-04-29 DIAGNOSIS — O34219 Maternal care for unspecified type scar from previous cesarean delivery: Secondary | ICD-10-CM | POA: Insufficient documentation

## 2013-04-29 NOTE — Progress Notes (Signed)
Tonya Olsen  was seen today for an ultrasound appointment.  See full report in [facility].  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  CVR today 1.9 cm2 No evidence of fetal hydrops Normal amniotic fluid volume  Recommendations: Continue weekly follow up for hydrops checks.  Follow up ultrasound for growth in 2 weeks. Would offer second course of betamethasone at 28 weeks.  Plan transfer of care ~ 34-36 weeks.  Based on size of CPAM, would recommend delivery at [facility] for ECMO support.  Alpha Gula, MD

## 2013-05-06 ENCOUNTER — Ambulatory Visit (HOSPITAL_COMMUNITY)
Admission: RE | Admit: 2013-05-06 | Discharge: 2013-05-06 | Disposition: A | Discharge status: Home / Self Care | Payer: BC Managed Care – PPO | Source: Ambulatory Visit | Attending: Obstetrics and Gynecology | Admitting: Obstetrics and Gynecology

## 2013-05-06 DIAGNOSIS — O352XX0 Maternal care for (suspected) hereditary disease in fetus, not applicable or unspecified: Secondary | ICD-10-CM | POA: Insufficient documentation

## 2013-05-06 DIAGNOSIS — O337XX0 Maternal care for disproportion due to other fetal deformities, not applicable or unspecified: Secondary | ICD-10-CM | POA: Insufficient documentation

## 2013-05-06 DIAGNOSIS — O34219 Maternal care for unspecified type scar from previous cesarean delivery: Secondary | ICD-10-CM | POA: Insufficient documentation

## 2013-05-06 DIAGNOSIS — O358XX0 Maternal care for other (suspected) fetal abnormality and damage, not applicable or unspecified: Secondary | ICD-10-CM | POA: Insufficient documentation

## 2013-05-06 MED ORDER — BETAMETHASONE SOD PHOS & ACET 6 (3-3) MG/ML IJ SUSP
12.0000 mg | Freq: Once | INTRAMUSCULAR | Status: AC
  Administered 2013-05-06: 12 mg via INTRAMUSCULAR
  Filled 2013-05-06: qty 2

## 2013-05-06 NOTE — Progress Notes (Signed)
Tonya Olsen  was seen today for an ultrasound appointment.  See full report in [facility].  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  CVR today 1.7 cm2 No evidence of fetal hydrops Normal amniotic fluid volume  Recommendations: Will give second course of betamethasone today and tomorrow Follow up ultrasound for growth / hydrops check next week  Plan transfer of care ~ 34-36 weeks.  Based on size of CPAM, would recommend delivery at [facility] for ECMO support.  Alpha Gula, MD

## 2013-05-07 ENCOUNTER — Ambulatory Visit (HOSPITAL_COMMUNITY)
Admission: RE | Admit: 2013-05-07 | Discharge: 2013-05-07 | Disposition: A | Discharge status: Home / Self Care | Payer: BC Managed Care – PPO | Source: Ambulatory Visit | Attending: Obstetrics and Gynecology | Admitting: Obstetrics and Gynecology

## 2013-05-07 ENCOUNTER — Ambulatory Visit (HOSPITAL_COMMUNITY): Payer: BC Managed Care – PPO

## 2013-05-07 DIAGNOSIS — O358XX0 Maternal care for other (suspected) fetal abnormality and damage, not applicable or unspecified: Secondary | ICD-10-CM | POA: Insufficient documentation

## 2013-05-07 DIAGNOSIS — O34219 Maternal care for unspecified type scar from previous cesarean delivery: Secondary | ICD-10-CM | POA: Insufficient documentation

## 2013-05-07 DIAGNOSIS — O337XX0 Maternal care for disproportion due to other fetal deformities, not applicable or unspecified: Secondary | ICD-10-CM | POA: Insufficient documentation

## 2013-05-07 DIAGNOSIS — O352XX0 Maternal care for (suspected) hereditary disease in fetus, not applicable or unspecified: Secondary | ICD-10-CM | POA: Insufficient documentation

## 2013-05-07 MED ORDER — BETAMETHASONE SOD PHOS & ACET 6 (3-3) MG/ML IJ SUSP
12.0000 mg | Freq: Once | INTRAMUSCULAR | Status: AC
  Administered 2013-05-07: 12 mg via INTRAMUSCULAR
  Filled 2013-05-07: qty 2

## 2013-05-14 ENCOUNTER — Ambulatory Visit (HOSPITAL_COMMUNITY)
Admission: RE | Admit: 2013-05-14 | Discharge: 2013-05-14 | Disposition: A | Discharge status: Home / Self Care | Payer: BC Managed Care – PPO | Source: Ambulatory Visit | Attending: Obstetrics and Gynecology | Admitting: Obstetrics and Gynecology

## 2013-05-14 ENCOUNTER — Other Ambulatory Visit (HOSPITAL_COMMUNITY): Payer: Self-pay | Admitting: Maternal and Fetal Medicine

## 2013-05-14 DIAGNOSIS — O352XX0 Maternal care for (suspected) hereditary disease in fetus, not applicable or unspecified: Secondary | ICD-10-CM | POA: Insufficient documentation

## 2013-05-14 DIAGNOSIS — O34219 Maternal care for unspecified type scar from previous cesarean delivery: Secondary | ICD-10-CM | POA: Insufficient documentation

## 2013-05-14 DIAGNOSIS — O358XX0 Maternal care for other (suspected) fetal abnormality and damage, not applicable or unspecified: Secondary | ICD-10-CM | POA: Insufficient documentation

## 2013-05-14 DIAGNOSIS — O337XX0 Maternal care for disproportion due to other fetal deformities, not applicable or unspecified: Secondary | ICD-10-CM | POA: Insufficient documentation

## 2013-05-21 ENCOUNTER — Ambulatory Visit (HOSPITAL_COMMUNITY)
Admission: RE | Admit: 2013-05-21 | Discharge: 2013-05-21 | Disposition: A | Discharge status: Home / Self Care | Payer: BC Managed Care – PPO | Source: Ambulatory Visit | Attending: Obstetrics and Gynecology | Admitting: Obstetrics and Gynecology

## 2013-05-21 DIAGNOSIS — O34219 Maternal care for unspecified type scar from previous cesarean delivery: Secondary | ICD-10-CM | POA: Insufficient documentation

## 2013-05-21 DIAGNOSIS — O352XX0 Maternal care for (suspected) hereditary disease in fetus, not applicable or unspecified: Secondary | ICD-10-CM | POA: Insufficient documentation

## 2013-05-21 DIAGNOSIS — O358XX0 Maternal care for other (suspected) fetal abnormality and damage, not applicable or unspecified: Secondary | ICD-10-CM | POA: Insufficient documentation

## 2013-05-21 DIAGNOSIS — O337XX0 Maternal care for disproportion due to other fetal deformities, not applicable or unspecified: Secondary | ICD-10-CM | POA: Insufficient documentation

## 2013-05-21 NOTE — Progress Notes (Signed)
Tonya Olsen  was seen today for an ultrasound appointment.  See full report in [facility].  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  CVR today 1.03. Limited ultrasound performed - no evidence of fetal hydrops Normal amniotic fluid volume  Recommendations: Follow up ultraound next for for hydrops check; growth in 2-3 weeks. Plan transfer of care to [facility] at ~ 34 weeks.  Benjaman Lobe, MD

## 2013-05-28 ENCOUNTER — Ambulatory Visit (HOSPITAL_COMMUNITY)
Admission: RE | Admit: 2013-05-28 | Discharge: 2013-05-28 | Disposition: A | Discharge status: Home / Self Care | Payer: BC Managed Care – PPO | Source: Ambulatory Visit | Attending: Obstetrics and Gynecology | Admitting: Obstetrics and Gynecology

## 2013-05-28 DIAGNOSIS — O352XX0 Maternal care for (suspected) hereditary disease in fetus, not applicable or unspecified: Secondary | ICD-10-CM | POA: Insufficient documentation

## 2013-05-28 DIAGNOSIS — O34219 Maternal care for unspecified type scar from previous cesarean delivery: Secondary | ICD-10-CM | POA: Insufficient documentation

## 2013-05-28 DIAGNOSIS — O358XX0 Maternal care for other (suspected) fetal abnormality and damage, not applicable or unspecified: Secondary | ICD-10-CM | POA: Insufficient documentation

## 2013-05-28 DIAGNOSIS — O337XX0 Maternal care for disproportion due to other fetal deformities, not applicable or unspecified: Secondary | ICD-10-CM | POA: Insufficient documentation

## 2013-06-04 ENCOUNTER — Ambulatory Visit (HOSPITAL_COMMUNITY)
Admission: RE | Admit: 2013-06-04 | Discharge: 2013-06-04 | Disposition: A | Discharge status: Home / Self Care | Payer: BC Managed Care – PPO | Source: Ambulatory Visit | Attending: Obstetrics and Gynecology | Admitting: Obstetrics and Gynecology

## 2013-06-04 DIAGNOSIS — O358XX0 Maternal care for other (suspected) fetal abnormality and damage, not applicable or unspecified: Secondary | ICD-10-CM | POA: Insufficient documentation

## 2013-06-04 DIAGNOSIS — O34219 Maternal care for unspecified type scar from previous cesarean delivery: Secondary | ICD-10-CM | POA: Insufficient documentation

## 2013-06-04 DIAGNOSIS — O337XX0 Maternal care for disproportion due to other fetal deformities, not applicable or unspecified: Secondary | ICD-10-CM | POA: Insufficient documentation

## 2013-06-04 DIAGNOSIS — O352XX0 Maternal care for (suspected) hereditary disease in fetus, not applicable or unspecified: Secondary | ICD-10-CM | POA: Insufficient documentation

## 2013-06-05 ENCOUNTER — Other Ambulatory Visit (HOSPITAL_COMMUNITY): Payer: Self-pay | Admitting: Obstetrics and Gynecology

## 2013-06-05 DIAGNOSIS — O358XX0 Maternal care for other (suspected) fetal abnormality and damage, not applicable or unspecified: Secondary | ICD-10-CM

## 2013-06-05 DIAGNOSIS — O337XX0 Maternal care for disproportion due to other fetal deformities, not applicable or unspecified: Secondary | ICD-10-CM

## 2013-06-05 DIAGNOSIS — O34219 Maternal care for unspecified type scar from previous cesarean delivery: Secondary | ICD-10-CM

## 2013-06-11 ENCOUNTER — Ambulatory Visit (HOSPITAL_COMMUNITY): Payer: BC Managed Care – PPO

## 2013-06-18 ENCOUNTER — Ambulatory Visit (HOSPITAL_COMMUNITY): Payer: BC Managed Care – PPO

## 2013-06-18 DIAGNOSIS — O34219 Maternal care for unspecified type scar from previous cesarean delivery: Secondary | ICD-10-CM | POA: Insufficient documentation

## 2013-06-25 ENCOUNTER — Ambulatory Visit (HOSPITAL_COMMUNITY): Payer: BC Managed Care – PPO

## 2013-07-02 ENCOUNTER — Ambulatory Visit (HOSPITAL_COMMUNITY)
Admission: RE | Admit: 2013-07-02 | Discharge: 2013-07-02 | Disposition: A | Discharge status: Home / Self Care | Payer: BC Managed Care – PPO | Source: Ambulatory Visit | Attending: Family Medicine | Admitting: Family Medicine

## 2013-07-02 DIAGNOSIS — O34219 Maternal care for unspecified type scar from previous cesarean delivery: Secondary | ICD-10-CM

## 2013-07-02 DIAGNOSIS — O352XX0 Maternal care for (suspected) hereditary disease in fetus, not applicable or unspecified: Secondary | ICD-10-CM | POA: Insufficient documentation

## 2013-07-02 DIAGNOSIS — O358XX0 Maternal care for other (suspected) fetal abnormality and damage, not applicable or unspecified: Secondary | ICD-10-CM

## 2013-07-02 DIAGNOSIS — O337XX0 Maternal care for disproportion due to other fetal deformities, not applicable or unspecified: Secondary | ICD-10-CM

## 2014-01-15 ENCOUNTER — Encounter (HOSPITAL_COMMUNITY): Payer: Self-pay | Admitting: *Deleted

## 2014-03-09 ENCOUNTER — Encounter (HOSPITAL_COMMUNITY): Payer: Self-pay | Admitting: *Deleted

## 2015-06-14 ENCOUNTER — Emergency Department (HOSPITAL_COMMUNITY)
Admission: EM | Admit: 2015-06-14 | Discharge: 2015-06-14 | Disposition: A | Discharge status: Home / Self Care | Payer: BC Managed Care – PPO | Attending: Emergency Medicine | Admitting: Emergency Medicine

## 2015-06-14 ENCOUNTER — Emergency Department (HOSPITAL_COMMUNITY): Payer: BC Managed Care – PPO

## 2015-06-14 ENCOUNTER — Encounter (HOSPITAL_COMMUNITY): Payer: Self-pay

## 2015-06-14 DIAGNOSIS — D259 Leiomyoma of uterus, unspecified: Secondary | ICD-10-CM | POA: Diagnosis not present

## 2015-06-14 DIAGNOSIS — R1030 Lower abdominal pain, unspecified: Secondary | ICD-10-CM

## 2015-06-14 DIAGNOSIS — N83202 Unspecified ovarian cyst, left side: Secondary | ICD-10-CM | POA: Diagnosis not present

## 2015-06-14 DIAGNOSIS — Z9889 Other specified postprocedural states: Secondary | ICD-10-CM | POA: Diagnosis not present

## 2015-06-14 DIAGNOSIS — Z792 Long term (current) use of antibiotics: Secondary | ICD-10-CM | POA: Diagnosis not present

## 2015-06-14 DIAGNOSIS — N939 Abnormal uterine and vaginal bleeding, unspecified: Secondary | ICD-10-CM | POA: Insufficient documentation

## 2015-06-14 DIAGNOSIS — R102 Pelvic and perineal pain: Secondary | ICD-10-CM

## 2015-06-14 DIAGNOSIS — Z3202 Encounter for pregnancy test, result negative: Secondary | ICD-10-CM | POA: Insufficient documentation

## 2015-06-14 DIAGNOSIS — N949 Unspecified condition associated with female genital organs and menstrual cycle: Secondary | ICD-10-CM

## 2015-06-14 LAB — COMPREHENSIVE METABOLIC PANEL
ALBUMIN: 4.3 g/dL (ref 3.5–5.0)
ALK PHOS: 101 U/L (ref 38–126)
ALT: 28 U/L (ref 14–54)
ANION GAP: 9 (ref 5–15)
AST: 21 U/L (ref 15–41)
BUN: 9 mg/dL (ref 6–20)
CALCIUM: 9.1 mg/dL (ref 8.9–10.3)
CHLORIDE: 106 mmol/L (ref 101–111)
CO2: 25 mmol/L (ref 22–32)
CREATININE: 0.61 mg/dL (ref 0.44–1.00)
GFR calc Af Amer: >60 mL/min (ref >60)
GFR calc non Af Amer: >60 mL/min (ref >60)
GLUCOSE: 106 mg/dL — AB (ref 65–99)
Potassium: 4.1 mmol/L (ref 3.5–5.1)
SODIUM: 140 mmol/L (ref 135–145)
Total Bilirubin: 0.5 mg/dL (ref 0.3–1.2)
Total Protein: 7.6 g/dL (ref 6.5–8.1)

## 2015-06-14 LAB — URINALYSIS, ROUTINE W REFLEX MICROSCOPIC
Bilirubin Urine: NEGATIVE
GLUCOSE, UA: NEGATIVE mg/dL
Ketones, ur: NEGATIVE mg/dL
LEUKOCYTES UA: NEGATIVE
NITRITE: NEGATIVE
PH: 5.5 (ref 5.0–8.0)
PROTEIN: NEGATIVE mg/dL
SPECIFIC GRAVITY, URINE: 1.015 (ref 1.005–1.030)

## 2015-06-14 LAB — WET PREP, GENITAL
Clue Cells Wet Prep HPF POC: NONE SEEN
SPERM: NONE SEEN
Trich, Wet Prep: NONE SEEN
Yeast Wet Prep HPF POC: NONE SEEN

## 2015-06-14 LAB — CBC
HCT: 37.4 % (ref 36.0–46.0)
HEMOGLOBIN: 12.3 g/dL (ref 12.0–15.0)
MCH: 28 pg (ref 26.0–34.0)
MCHC: 32.9 g/dL (ref 30.0–36.0)
MCV: 85 fL (ref 78.0–100.0)
Platelets: 264 10*3/uL (ref 150–400)
RBC: 4.4 MIL/uL (ref 3.87–5.11)
RDW: 12.8 % (ref 11.5–15.5)
WBC: 11.8 10*3/uL — ABNORMAL HIGH (ref 4.0–10.5)

## 2015-06-14 LAB — URINE MICROSCOPIC-ADD ON: WBC, UA: NONE SEEN WBC/hpf (ref 0–5)

## 2015-06-14 LAB — I-STAT BETA HCG BLOOD, ED (MC, WL, AP ONLY)

## 2015-06-14 LAB — LIPASE, BLOOD: Lipase: 28 U/L (ref 11–51)

## 2015-06-14 MED ORDER — IBUPROFEN 800 MG PO TABS: 800.0000 mg | ORAL_TABLET | Freq: Three times a day (TID) | ORAL | Status: DC

## 2015-06-14 MED ORDER — SODIUM CHLORIDE 0.9 % IV SOLN
1000.0000 mL | Freq: Once | INTRAVENOUS | Status: AC
  Administered 2015-06-14: 1000 mL via INTRAVENOUS

## 2015-06-14 MED ORDER — SODIUM CHLORIDE 0.9 % IV SOLN
1000.0000 mL | INTRAVENOUS | Status: DC
  Administered 2015-06-14: 1000 mL via INTRAVENOUS

## 2015-06-14 MED ORDER — IOHEXOL 300 MG/ML  SOLN
100.0000 mL | Freq: Once | INTRAMUSCULAR | Status: AC | PRN
  Administered 2015-06-14: 100 mL via INTRAVENOUS

## 2015-06-14 MED ORDER — MORPHINE SULFATE (PF) 4 MG/ML IV SOLN
4.0000 mg | Freq: Once | INTRAVENOUS | Status: AC
  Administered 2015-06-14: 4 mg via INTRAVENOUS
  Filled 2015-06-14: qty 1

## 2015-06-14 MED ORDER — LIDOCAINE HCL 1 % IJ SOLN
INTRAMUSCULAR | Status: AC
  Filled 2015-06-14: qty 20

## 2015-06-14 MED ORDER — CEFTRIAXONE SODIUM 250 MG IJ SOLR
250.0000 mg | Freq: Once | INTRAMUSCULAR | Status: DC
  Filled 2015-06-14: qty 250

## 2015-06-14 MED ORDER — IOHEXOL 300 MG/ML  SOLN
25.0000 mL | Freq: Once | INTRAMUSCULAR | Status: AC | PRN
  Administered 2015-06-14: 25 mL via ORAL

## 2015-06-14 MED ORDER — TRAMADOL HCL 50 MG PO TABS: 100.0000 mg | ORAL_TABLET | Freq: Four times a day (QID) | ORAL | Status: DC | PRN

## 2015-06-14 MED ORDER — KETOROLAC TROMETHAMINE 30 MG/ML IJ SOLN
30.0000 mg | Freq: Once | INTRAMUSCULAR | Status: AC
  Administered 2015-06-14: 30 mg via INTRAVENOUS
  Filled 2015-06-14: qty 1

## 2015-06-14 MED ORDER — ONDANSETRON HCL 4 MG/2ML IJ SOLN
4.0000 mg | Freq: Once | INTRAMUSCULAR | Status: AC
  Administered 2015-06-14: 4 mg via INTRAVENOUS
  Filled 2015-06-14: qty 2

## 2015-06-14 MED ORDER — AZITHROMYCIN 1 G PO PACK
1.0000 g | PACK | Freq: Once | ORAL | Status: DC
  Filled 2015-06-14: qty 1

## 2015-06-14 MED ORDER — METRONIDAZOLE 500 MG PO TABS
2000.0000 mg | ORAL_TABLET | Freq: Once | ORAL | Status: AC
  Administered 2015-06-14: 2000 mg via ORAL
  Filled 2015-06-14: qty 4

## 2015-06-14 NOTE — ED Notes (Signed)
Per pt, started having abdominal pain last night at 11 pm.  Today pain has spread throughout abdomen.  Low grade fever at home.  No n/v/d.  Denies changes in urination.  No vaginal discharge.

## 2015-06-14 NOTE — ED Notes (Signed)
UNABLE TO COLLECT LABS AT THIS TIME STAFF WORKING WITH PATIENT.

## 2015-06-14 NOTE — ED Provider Notes (Signed)
CSN: [id number]     Arrival date & time 06/14/15  1332 History   First MD Initiated Contact with Patient 06/14/15 1620     Chief Complaint  Patient presents with  . Abdominal Pain     (Consider location/radiation/quality/duration/timing/severity/associated sxs/prior Treatment) HPI Patient reports she developed fairly severe abdominal pain starting at 11 PM last night. She reports it has worsened and now she has severe pain which is more centralized but also spreads across her lower abdomen. She appreciates some pain in her back. She denies pain burning or urgency urination. She denies abnormal vaginal discharge. He is currently on a menstrual cycle which is normal timing for her. She reports sometimes she gets cramps but never expresses pain of this severity. She is sexually active and monogamous relationship. No nausea vomiting or diarrhea. History reviewed. No pertinent past medical history. Past Surgical History  Procedure Laterality Date  . Cesarean section      x 2  . Wisdom tooth extraction     History reviewed. No pertinent family history. Social History  Substance Use Topics  . Smoking status: Never Smoker   . Smokeless tobacco: None  . Alcohol Use: No   OB History    Gravida Para Term Preterm AB TAB SAB Ectopic Multiple Living   2 1 1       1      Review of Systems  10 Systems reviewed and are negative for acute change except as noted in the HPI.   Allergies  Review of patient's allergies indicates no known allergies.  Home Medications   Prior to Admission medications   Medication Sig Start Date End Date Taking? Authorizing Provider  acetaminophen (TYLENOL) 325 MG tablet Take 650-975 mg by mouth every 6 (six) hours as needed for moderate pain or fever.   Yes Historical Provider, MD  docusate sodium (COLACE) 100 MG capsule Take 100 mg by mouth daily as needed for mild constipation.   Yes Historical Provider, MD  ibuprofen (ADVIL,MOTRIN) 800 MG tablet Take 1 tablet  (800 mg total) by mouth 3 (three) times daily. 06/14/15   Charlesetta Shanks, MD  sulfamethoxazole-trimethoprim (BACTRIM DS,SEPTRA DS) 800-160 MG tablet Take 1 tablet by mouth 2 (two) times daily. Reported on 06/14/2015 06/02/15   Historical Provider, MD  traMADol (ULTRAM) 50 MG tablet Take 2 tablets (100 mg total) by mouth every 6 (six) hours as needed. 06/14/15   Charlesetta Shanks, MD   BP 101/65 mmHg  Pulse 100  Temp(Src) 99 F (37.2 C) (Oral)  Resp 18  SpO2 100%  LMP 06/11/2015 Physical Exam  Constitutional: She is oriented to person, place, and time. She appears well-developed and well-nourished.  Patient is alert and nontoxic. She does appear to be in severe pain.  HENT:  Head: Normocephalic and atraumatic.  Mouth/Throat: Oropharynx is clear and moist.  Eyes: EOM are normal. Pupils are equal, round, and reactive to light.  Neck: Neck supple.  Cardiovascular: Normal rate, regular rhythm, normal heart sounds and intact distal pulses.   Pulmonary/Chest: Effort normal and breath sounds normal.  Abdominal: Soft. Bowel sounds are normal. She exhibits no distension. There is tenderness.  Severe pain to palpation lower abdomen diffusely. Palpation of upper abdomen exacerbates lower abdominal pain.  Genitourinary:  Normal external female genitalia. Speculum examination, cervix is normal in appearance without friability or discharge. Small amount of vaginal blood. No clots or pooling. Positive cervical motion tenderness. Severe tenderness of the uterus to palpation. No appreciable mass in the adnexa.  Musculoskeletal:  Normal range of motion. She exhibits no edema or tenderness.  Neurological: She is alert and oriented to person, place, and time. She has normal strength. Coordination normal. GCS eye subscore is 4. GCS verbal subscore is 5. GCS motor subscore is 6.  Skin: Skin is warm, dry and intact.  Psychiatric: She has a normal mood and affect.    ED Course  Procedures (including critical care  time) Labs Review Labs Reviewed  WET PREP, GENITAL - Abnormal; Notable for the following:    WBC, Wet Prep HPF POC FEW (*)    All other components within normal limits  COMPREHENSIVE METABOLIC PANEL - Abnormal; Notable for the following:    Glucose, Bld 106 (*)    All other components within normal limits  CBC - Abnormal; Notable for the following:    WBC 11.8 (*)    All other components within normal limits  URINALYSIS, ROUTINE W REFLEX MICROSCOPIC (NOT AT [facility]) - Abnormal; Notable for the following:    Hgb urine dipstick SMALL (*)    All other components within normal limits  URINE MICROSCOPIC-ADD ON - Abnormal; Notable for the following:    Squamous Epithelial / LPF 0-5 (*)    Bacteria, UA RARE (*)    All other components within normal limits  LIPASE, BLOOD  RPR  HIV ANTIBODY (ROUTINE TESTING)  I-STAT BETA HCG BLOOD, ED ([facility], [facility], [facility] ONLY)  GC/CHLAMYDIA PROBE AMP ([place]) NOT AT [facility]    Imaging Review US Transvaginal Non-ob  06/14/2015  CLINICAL DATA:  Acute onset of pelvic pain.  Initial encounter. EXAM: TRANSABDOMINAL AND TRANSVAGINAL ULTRASOUND OF PELVIS TECHNIQUE: Both transabdominal and transvaginal ultrasound examinations of the pelvis were performed. Transabdominal technique was performed for global imaging of the pelvis including uterus, ovaries, adnexal regions, and pelvic cul-de-sac. It was necessary to proceed with endovaginal exam following the transabdominal exam to visualize the uterus and ovaries in greater detail. COMPARISON:  None FINDINGS: Uterus Measurements: 8.5 x 4.2 x 6.3 cm. A 1.8 x 1.5 x 1.3 cm fibroid is noted at the uterine fundus, myometrial in nature. Endometrium Thickness: 0.3 cm.  No focal abnormality visualized. Right ovary Measurements: 2.9 x 1.8 x 1.8 cm. Normal appearance/no adnexal mass. Left ovary Measurements: 4.7 x 2.6 x 3.3 cm. A mildly complex 2.8 x 1.7 x 2.3 cm cyst is noted at the left ovary, with internal echoes, likely reflecting a  hemorrhagic cyst. Other findings No abnormal free fluid. IMPRESSION: 1. Mildly complex 2.8 cm cyst at the left ovary, with internal echoes, likely reflecting a hemorrhagic cyst. 2. No evidence for ovarian torsion. 3. Small uterine fibroid seen.  Uterus otherwise unremarkable. Electronically Signed   By: Garald Balding M.D.   On: 06/14/2015 21:28   US Pelvis Complete  06/14/2015  CLINICAL DATA:  Acute onset of pelvic pain.  Initial encounter. EXAM: TRANSABDOMINAL AND TRANSVAGINAL ULTRASOUND OF PELVIS TECHNIQUE: Both transabdominal and transvaginal ultrasound examinations of the pelvis were performed. Transabdominal technique was performed for global imaging of the pelvis including uterus, ovaries, adnexal regions, and pelvic cul-de-sac. It was necessary to proceed with endovaginal exam following the transabdominal exam to visualize the uterus and ovaries in greater detail. COMPARISON:  None FINDINGS: Uterus Measurements: 8.5 x 4.2 x 6.3 cm. A 1.8 x 1.5 x 1.3 cm fibroid is noted at the uterine fundus, myometrial in nature. Endometrium Thickness: 0.3 cm.  No focal abnormality visualized. Right ovary Measurements: 2.9 x 1.8 x 1.8 cm. Normal appearance/no adnexal mass. Left ovary Measurements:  4.7 x 2.6 x 3.3 cm. A mildly complex 2.8 x 1.7 x 2.3 cm cyst is noted at the left ovary, with internal echoes, likely reflecting a hemorrhagic cyst. Other findings No abnormal free fluid. IMPRESSION: 1. Mildly complex 2.8 cm cyst at the left ovary, with internal echoes, likely reflecting a hemorrhagic cyst. 2. No evidence for ovarian torsion. 3. Small uterine fibroid seen.  Uterus otherwise unremarkable. Electronically Signed   By: Garald Balding M.D.   On: 06/14/2015 21:28   Ct Abdomen Pelvis W Contrast  06/14/2015  CLINICAL DATA:  Lower abdominal pain.  This began yesterday. EXAM: CT ABDOMEN AND PELVIS WITH CONTRAST TECHNIQUE: Multidetector CT imaging of the abdomen and pelvis was performed using the standard protocol  following bolus administration of intravenous contrast. CONTRAST:  164mL OMNIPAQUE IOHEXOL 300 MG/ML SOLN, 32mL OMNIPAQUE IOHEXOL 300 MG/ML SOLN COMPARISON:  Ultrasound of the abdomen 02/24/2010. FINDINGS: BODY WALL: Unremarkable. LOWER CHEST: Unremarkable. ABDOMEN/PELVIS: Liver: No focal abnormality.  Hepatomegaly with probable steatosis. Biliary: No evidence of biliary obstruction or stone. Pancreas: Unremarkable. Spleen: Unremarkable. Adrenals: Unremarkable. Kidneys and ureters: No hydronephrosis or stone. Bladder: Unremarkable. Reproductive: Unremarkable.  Tampon. Bowel: No obstruction. Normal appendix. Retroperitoneum: No mass or adenopathy. Peritoneum: No free fluid or gas. Vascular: No acute abnormality. OSSEOUS: No acute abnormalities. IMPRESSION: No acute intra-abdominal findings. Hepatomegaly and probable steatosis. Electronically Signed   By: Staci Righter M.D.   On: 06/14/2015 18:55   I have personally reviewed and evaluated these images and lab results as part of my medical decision-making.   EKG Interpretation None      MDM   Final diagnoses:  Lower abdominal pain  Cervical motion tenderness  Cyst of left ovary   Patient presents with fairly severe pelvic pain. CT does not show appendicitis or other surgical etiology. Ultrasound has ruled out torsion. Does have a 2 cm ovarian cyst identified and small fibroids. On bimanual examination, patient has significant cervical motion tenderness and uterine tenderness to palpation. For her this is disproportionate to any typical menstrual pain. At this time I will treat her empiracally with Rocephin, Zithromax and Flagyl. She is advised to abstain from sexual activity until her culture results have returned. She is also advised to follow-up with her gynecologist this week for recheck. Ibuprofen and tramadol prescribed for pain control.    Charlesetta Shanks, MD 06/14/15 2153

## 2015-06-14 NOTE — ED Notes (Signed)
US at bedside

## 2015-06-14 NOTE — Discharge Instructions (Signed)
Pelvic Pain, Female No Sexual activity until your lab results are returned. Follow-up with your gynecologist this week for recheck. Female pelvic pain can be caused by many different things and start from a variety of places. Pelvic pain refers to pain that is located in the lower half of the abdomen and between your hips. The pain may occur over a short period of time (acute) or may be reoccurring (chronic). The cause of pelvic pain may be related to disorders affecting the female reproductive organs (gynecologic), but it may also be related to the bladder, kidney stones, an intestinal complication, or muscle or skeletal problems. Getting help right away for pelvic pain is important, especially if there has been severe, sharp, or a sudden onset of unusual pain. It is also important to get help right away because some types of pelvic pain can be life threatening.  CAUSES  Below are only some of the causes of pelvic pain. The causes of pelvic pain can be in one of several categories.   Gynecologic.  Pelvic inflammatory disease.  Sexually transmitted infection.  Ovarian cyst or a twisted ovarian ligament (ovarian torsion).  Uterine lining that grows outside the uterus (endometriosis).  Fibroids, cysts, or tumors.  Ovulation.  Pregnancy.  Pregnancy that occurs outside the uterus (ectopic pregnancy).  Miscarriage.  Labor.  Abruption of the placenta or ruptured uterus.  Infection.  Uterine infection (endometritis).  Bladder infection.  Diverticulitis.  Miscarriage related to a uterine infection (septic abortion).  Bladder.  Inflammation of the bladder (cystitis).  Kidney stone(s).  Gastrointestinal.  Constipation.  Diverticulitis.  Neurologic.  Trauma.  Feeling pelvic pain because of mental or emotional causes (psychosomatic).  Cancers of the bowel or pelvis. EVALUATION  Your caregiver will want to take a careful history of your concerns. This includes recent  changes in your health, a careful gynecologic history of your periods (menses), and a sexual history. Obtaining your family history and medical history is also important. Your caregiver may suggest a pelvic exam. A pelvic exam will help identify the location and severity of the pain. It also helps in the evaluation of which organ system may be involved. In order to identify the cause of the pelvic pain and be properly treated, your caregiver may order tests. These tests may include:   A pregnancy test.  Pelvic ultrasonography.  An X-ray exam of the abdomen.  A urinalysis or evaluation of vaginal discharge.  Blood tests. HOME CARE INSTRUCTIONS   Only take over-the-counter or prescription medicines for pain, discomfort, or fever as directed by your caregiver.   Rest as directed by your caregiver.   Eat a balanced diet.   Drink enough fluids to make your urine clear or pale yellow, or as directed.   Avoid sexual intercourse if it causes pain.   Apply warm or cold compresses to the lower abdomen depending on which one helps the pain.   Avoid stressful situations.   Keep a journal of your pelvic pain. Write down when it started, where the pain is located, and if there are things that seem to be associated with the pain, such as food or your menstrual cycle.  Follow up with your caregiver as directed.  SEEK MEDICAL CARE IF:  Your medicine does not help your pain.  You have abnormal vaginal discharge. SEEK IMMEDIATE MEDICAL CARE IF:   You have heavy bleeding from the vagina.   Your pelvic pain increases.   You feel light-headed or faint.   You have chills.  You have pain with urination or blood in your urine.   You have uncontrolled diarrhea or vomiting.   You have a fever or persistent symptoms for more than 3 days.  You have a fever and your symptoms suddenly get worse.   You are being physically or sexually abused.   This information is not intended  to replace advice given to you by your health care provider. Make sure you discuss any questions you have with your health care provider.   Document Released: 03/21/2004 Document Revised: 01/13/2015 Document Reviewed: 08/14/2011 Elsevier Interactive Patient Education 2016 Else[employer] Ovarian Cyst An ovarian cyst is a fluid-filled sac that forms on an ovary. The ovaries are small organs that produce eggs in women. Various types of cysts can form on the ovaries. Most are not cancerous. Many do not cause problems, and they often go away on their own. Some may cause symptoms and require treatment. Common types of ovarian cysts include:  Functional cysts--These cysts may occur every month during the menstrual cycle. This is normal. The cysts usually go away with the next menstrual cycle if the woman does not get pregnant. Usually, there are no symptoms with a functional cyst.  Endometrioma cysts--These cysts form from the tissue that lines the uterus. They are also called "chocolate cysts" because they become filled with blood that turns brown. This type of cyst can cause pain in the lower abdomen during intercourse and with your menstrual period.  Cystadenoma cysts--This type develops from the cells on the outside of the ovary. These cysts can get very big and cause lower abdomen pain and pain with intercourse. This type of cyst can twist on itself, cut off its blood supply, and cause severe pain. It can also easily rupture and cause a lot of pain.  Dermoid cysts--This type of cyst is sometimes found in both ovaries. These cysts may contain different kinds of body tissue, such as skin, teeth, hair, or cartilage. They usually do not cause symptoms unless they get very big.  Theca lutein cysts--These cysts occur when too much of a certain hormone (human chorionic gonadotropin) is produced and overstimulates the ovaries to produce an egg. This is most common after procedures used to assist with the  conception of a baby (in vitro fertilization). CAUSES   Fertility drugs can cause a condition in which multiple large cysts are formed on the ovaries. This is called ovarian hyperstimulation syndrome.  A condition called polycystic ovary syndrome can cause hormonal imbalances that can lead to nonfunctional ovarian cysts. SIGNS AND SYMPTOMS  Many ovarian cysts do not cause symptoms. If symptoms are present, they may include:  Pelvic pain or pressure.  Pain in the lower abdomen.  Pain during sexual intercourse.  Increasing girth (swelling) of the abdomen.  Abnormal menstrual periods.  Increasing pain with menstrual periods.  Stopping having menstrual periods without being pregnant. DIAGNOSIS  These cysts are commonly found during a routine or annual pelvic exam. Tests may be ordered to find out more about the cyst. These tests may include:  Ultrasound.  X-ray of the pelvis.  CT scan.  MRI.  Blood tests. TREATMENT  Many ovarian cysts go away on their own without treatment. Your health care provider may want to check your cyst regularly for 2-3 months to see if it changes. For women in menopause, it is particularly important to monitor a cyst closely because of the higher rate of ovarian cancer in menopausal women. When treatment is needed, it may include  any of the following:  A procedure to drain the cyst (aspiration). This may be done using a long needle and ultrasound. It can also be done through a laparoscopic procedure. This involves using a thin, lighted tube with a tiny camera on the end (laparoscope) inserted through a small incision.  Surgery to remove the whole cyst. This may be done using laparoscopic surgery or an open surgery involving a larger incision in the lower abdomen.  Hormone treatment or birth control pills. These methods are sometimes used to help dissolve a cyst. HOME CARE INSTRUCTIONS   Only take over-the-counter or prescription medicines as directed  by your health care provider.  Follow up with your health care provider as directed.  Get regular pelvic exams and Pap tests. SEEK MEDICAL CARE IF:   Your periods are late, irregular, or painful, or they stop.  Your pelvic pain or abdominal pain does not go away.  Your abdomen becomes larger or swollen.  You have pressure on your bladder or trouble emptying your bladder completely.  You have pain during sexual intercourse.  You have feelings of fullness, pressure, or discomfort in your stomach.  You lose weight for no apparent reason.  You feel generally ill.  You become constipated.  You lose your appetite.  You develop acne.  You have an increase in body and facial hair.  You are gaining weight, without changing your exercise and eating habits.  You think you are pregnant. SEEK IMMEDIATE MEDICAL CARE IF:   You have increasing abdominal pain.  You feel sick to your stomach (nauseous), and you throw up (vomit).  You develop a fever that comes on suddenly.  You have abdominal pain during a bowel movement.  Your menstrual periods become heavier than usual. MAKE SURE YOU:  Understand these instructions.  Will watch your condition.  Will get help right away if you are not doing well or get worse.   This information is not intended to replace advice given to you by your health care provider. Make sure you discuss any questions you have with your health care provider.   Document Released: 04/24/2005 Document Revised: 04/29/2013 Document Reviewed: 12/30/2012 Elsevier Interactive Patient Education [employer].

## 2015-06-14 NOTE — ED Notes (Signed)
Patient transported to CT 

## 2015-06-15 LAB — HIV ANTIBODY (ROUTINE TESTING W REFLEX): HIV Screen 4th Generation wRfx: NONREACTIVE

## 2015-06-15 LAB — GC/CHLAMYDIA PROBE AMP (~~LOC~~) NOT AT ARMC
Chlamydia: NEGATIVE
NEISSERIA GONORRHEA: NEGATIVE

## 2015-06-15 LAB — RPR: RPR: NONREACTIVE

## 2015-07-28 IMAGING — US US OB LIMITED
1 series · 13 of 28 positions shown · non-contrast
Comparison: none

[Series 1: us ob limited · 0.22mm/px · 13 of 31 slices shown]
[im 2/31]
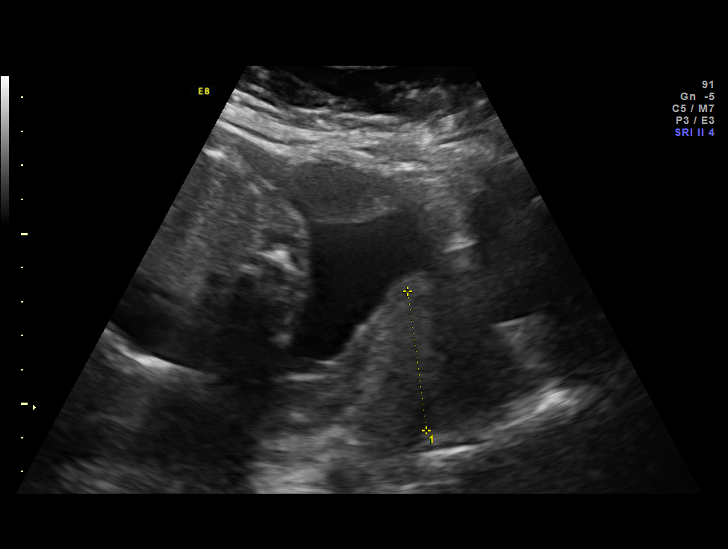
[im 4/31]
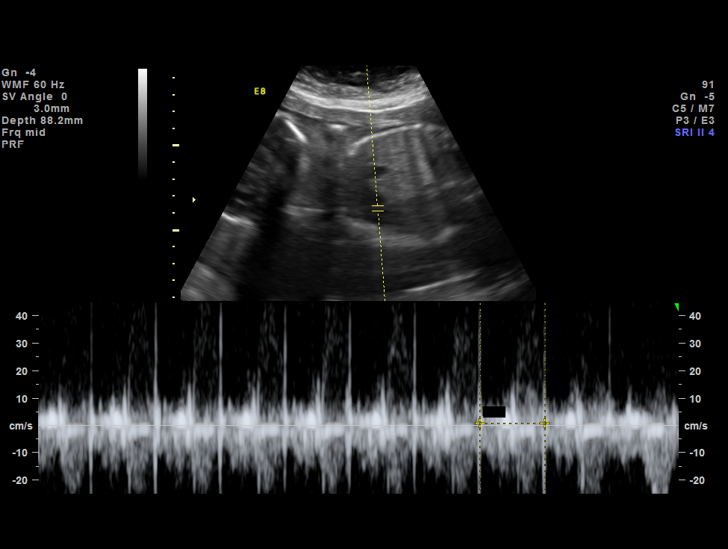
[im 6/31]
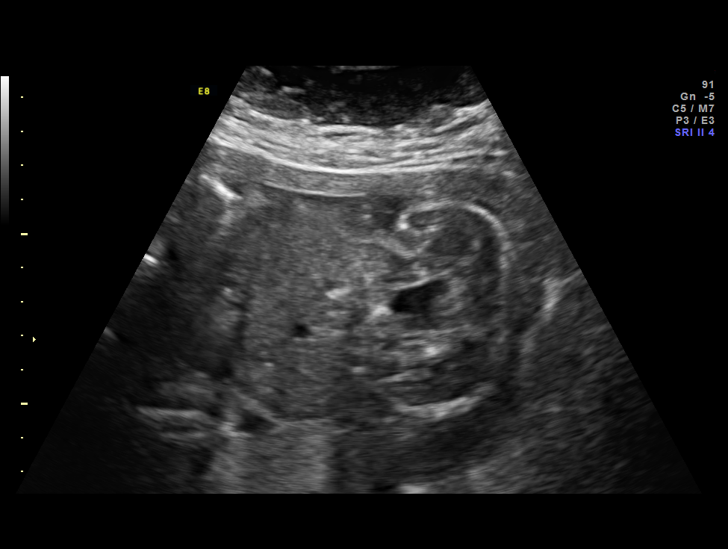
[im 8/31]
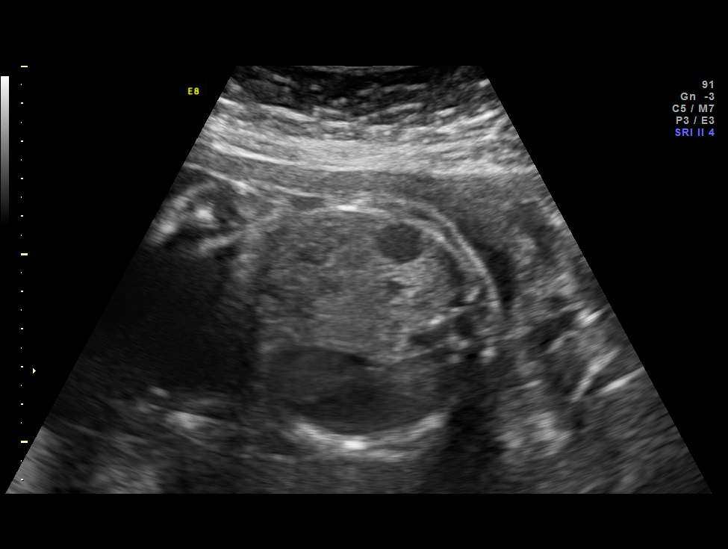
[im 11/31]
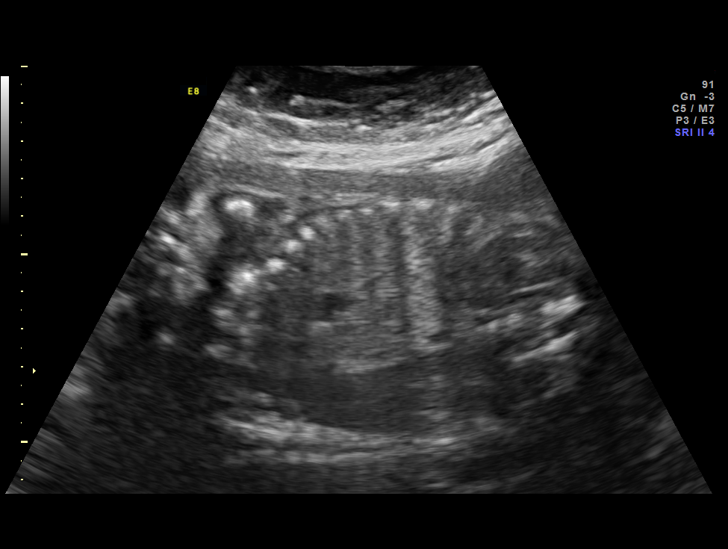
[im 13/31]
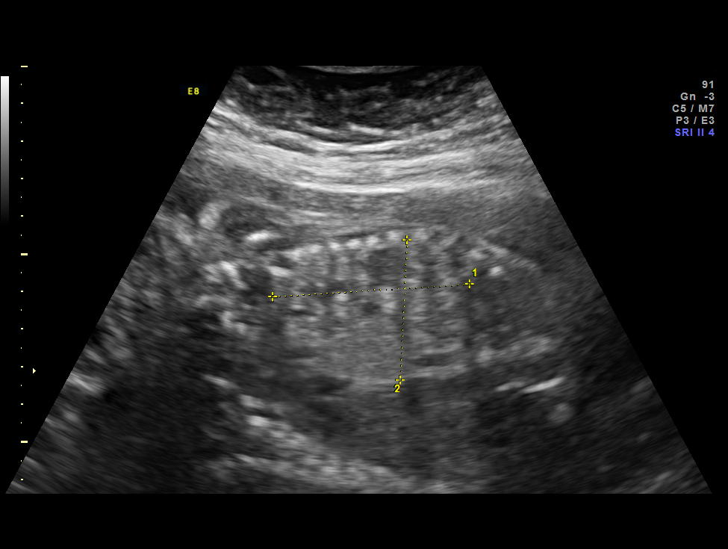
[im 16/31]
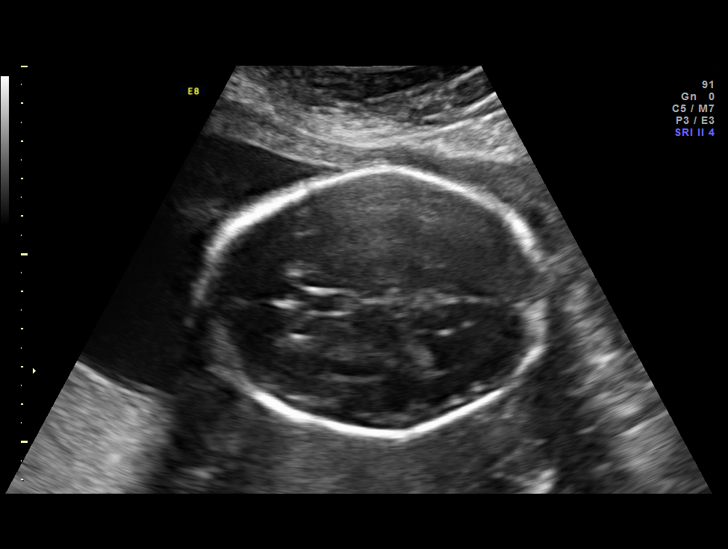
[im 18/31]
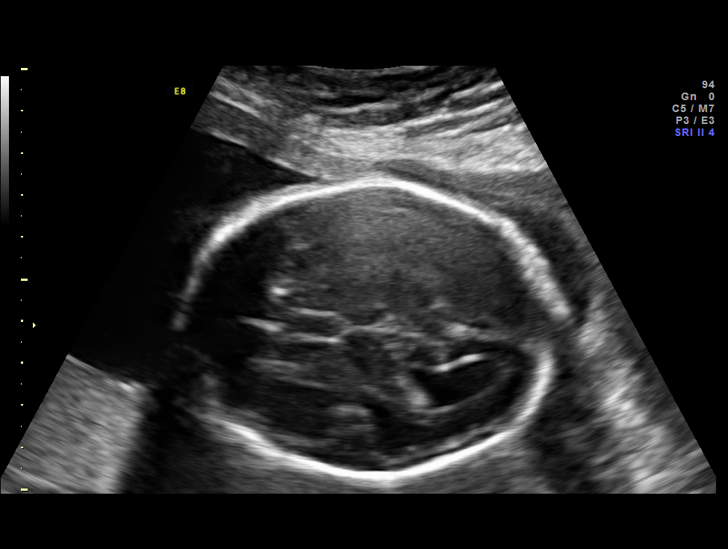
[im 21/31]
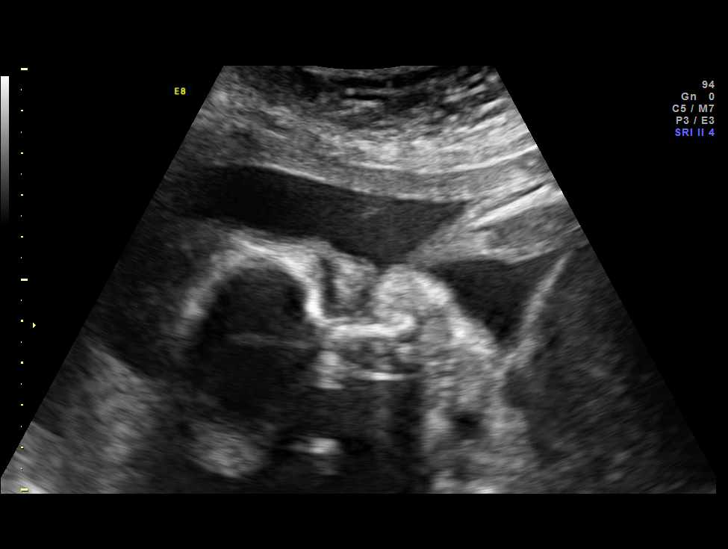
[im 23/31]
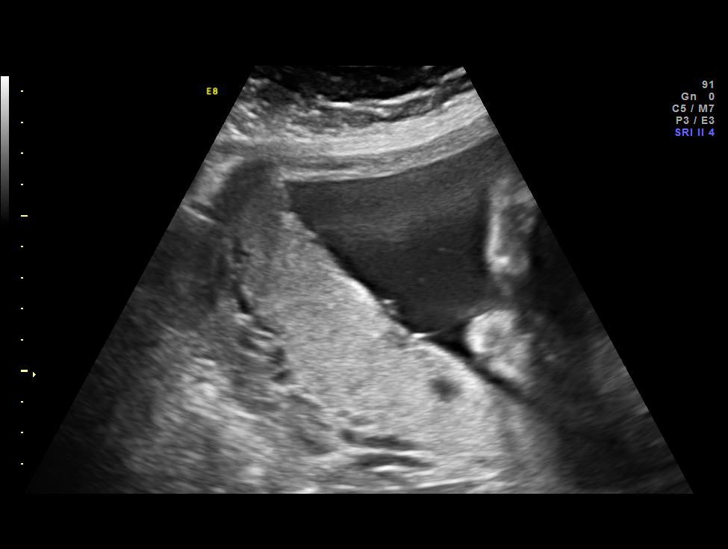
[im 25/31]
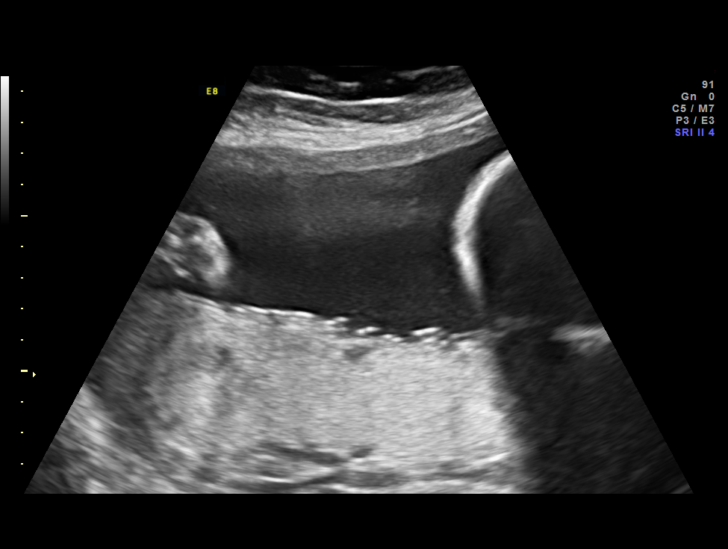
[im 27/31]
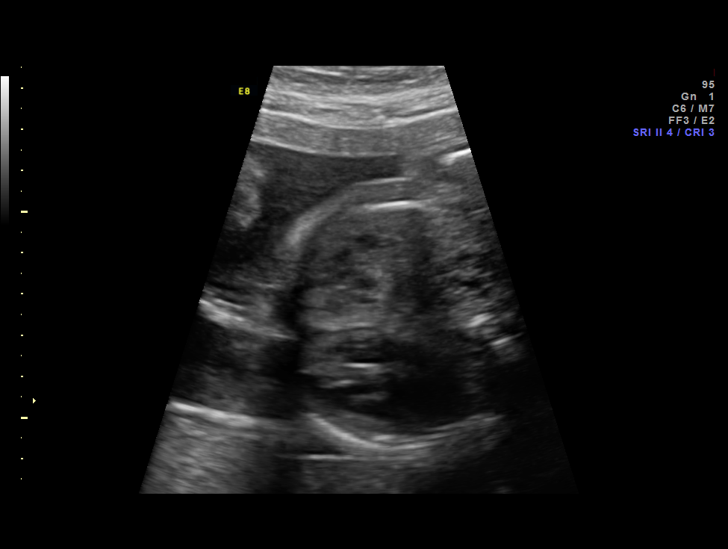
[im 29/31]
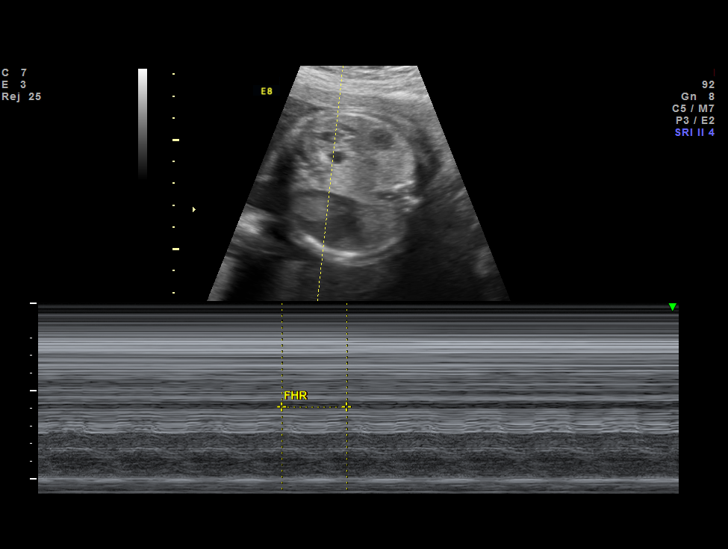

[13 of 28 positions shown; findings below may reference images not displayed]

OBSTETRICS REPORT
                      (Signed Final 04/29/2013 [DATE])

Service(s) Provided

 [HOSPITAL]                                         76815.0
Indications

 Fetal abnormality - other known or suspected
 (CCAM); type 2; left; S/P peds surgery consult;
 normal ECHO
 Previous cesarean section
 Poor obstetric history: Previous macrosomia
Fetal Evaluation

 Num Of Fetuses:    1
 Fetal Heart Rate:  155                          bpm
 Cardiac Activity:  Observed
 Presentation:      Frank breech
 Placenta:          Posterior, above cervical
                    os

 Amniotic Fluid
 AFI FV:      Subjectively within normal limits
                                             Larg Pckt:     7.6  cm
Gestational Age

 LMP:           26w 4d        Date:  10/25/12                 EDD:   08/01/13
 Best:          26w 4d     Det. By:  LMP  (10/25/12)          EDD:   08/01/13
Cervix Uterus Adnexa

 Cervical Length:    4.1      cm

 Cervix:       Normal appearance by transabdominal scan.
 Uterus:       No abnormality visualized.
 Cul De Sac:   No free fluid seen.
 Left Ovary:    Not visualized.
 Right Ovary:   Not visualized.

 Adnexa:     No abnormality visualized.
Impression

 Single IUP at 26 [DATE] weeks
 Left-sided Type II CPAM with mediastinal shift
 5.24 x 3.73 x 5.12 cm mass.  CVR today 1.9 cm2
 No evidence of fetal hydrops
 Normal amniotic fluid volume
Recommendations

 Continue weekly follow up for hydrops checks.  Follow up
 ultrasound for growth in 2 weeks.
 Would offer second course of Roberto at 28 weeks.

 Plan transfer of care 
 34-36 weeks.  Based on size of
 CPAM, would recommend delivery at [facility] for ECMO
 support.

 questions or concerns.

## 2015-10-20 ENCOUNTER — Other Ambulatory Visit: Payer: Self-pay | Admitting: Obstetrics and Gynecology

## 2015-10-21 LAB — CYTOLOGY - PAP

## 2015-11-12 ENCOUNTER — Encounter (HOSPITAL_COMMUNITY): Payer: Self-pay | Admitting: *Deleted

## 2015-11-15 ENCOUNTER — Ambulatory Visit (HOSPITAL_COMMUNITY)
Admission: RE | Admit: 2015-11-15 | Discharge: 2015-11-15 | Disposition: A | Discharge status: Home / Self Care | Payer: BC Managed Care – PPO | Source: Ambulatory Visit | Attending: Obstetrics and Gynecology | Admitting: Obstetrics and Gynecology

## 2015-11-15 ENCOUNTER — Ambulatory Visit (HOSPITAL_COMMUNITY): Payer: BC Managed Care – PPO | Admitting: Certified Registered Nurse Anesthetist

## 2015-11-15 ENCOUNTER — Encounter (HOSPITAL_COMMUNITY)
Admission: RE | Disposition: A | Discharge status: Home / Self Care | Payer: Self-pay | Source: Ambulatory Visit | Attending: Obstetrics and Gynecology

## 2015-11-15 ENCOUNTER — Encounter (HOSPITAL_COMMUNITY): Payer: Self-pay

## 2015-11-15 DIAGNOSIS — Z332 Encounter for elective termination of pregnancy: Secondary | ICD-10-CM | POA: Insufficient documentation

## 2015-11-15 DIAGNOSIS — F329 Major depressive disorder, single episode, unspecified: Secondary | ICD-10-CM | POA: Insufficient documentation

## 2015-11-15 DIAGNOSIS — Z302 Encounter for sterilization: Secondary | ICD-10-CM | POA: Insufficient documentation

## 2015-11-15 HISTORY — DX: Depression, unspecified: F32.A

## 2015-11-15 HISTORY — PX: DILATION AND EVACUATION: SHX1459

## 2015-11-15 HISTORY — DX: Major depressive disorder, single episode, unspecified: F32.9

## 2015-11-15 HISTORY — PX: LAPAROSCOPIC BILATERAL SALPINGECTOMY: SHX5889

## 2015-11-15 HISTORY — DX: Other complications of anesthesia, initial encounter: T88.59XA

## 2015-11-15 HISTORY — DX: Adverse effect of unspecified anesthetic, initial encounter: T41.45XA

## 2015-11-15 LAB — CBC
HEMATOCRIT: 36.4 % (ref 36.0–46.0)
Hemoglobin: 12.8 g/dL (ref 12.0–15.0)
MCH: 28.8 pg (ref 26.0–34.0)
MCHC: 35.2 g/dL (ref 30.0–36.0)
MCV: 81.8 fL (ref 78.0–100.0)
Platelets: 249 10*3/uL (ref 150–400)
RBC: 4.45 MIL/uL (ref 3.87–5.11)
RDW: 13.3 % (ref 11.5–15.5)
WBC: 10.1 10*3/uL (ref 4.0–10.5)

## 2015-11-15 SURGERY — DILATION AND EVACUATION, UTERUS
Anesthesia: General | Site: Vagina

## 2015-11-15 MED ORDER — PHENYLEPHRINE HCL 10 MG/ML IJ SOLN
INTRAMUSCULAR | Status: DC | PRN
  Administered 2015-11-15 (×2): 40 ug via INTRAVENOUS

## 2015-11-15 MED ORDER — FENTANYL CITRATE (PF) 100 MCG/2ML IJ SOLN
INTRAMUSCULAR | Status: DC | PRN
  Administered 2015-11-15: 25 ug via INTRAVENOUS
  Administered 2015-11-15 (×3): 50 ug via INTRAVENOUS
  Administered 2015-11-15: 25 ug via INTRAVENOUS
  Administered 2015-11-15: 50 ug via INTRAVENOUS

## 2015-11-15 MED ORDER — LIDOCAINE HCL (CARDIAC) 20 MG/ML IV SOLN
INTRAVENOUS | Status: AC
  Filled 2015-11-15: qty 5

## 2015-11-15 MED ORDER — KETOROLAC TROMETHAMINE 30 MG/ML IJ SOLN
INTRAMUSCULAR | Status: DC | PRN
  Administered 2015-11-15: 30 mg via INTRAVENOUS

## 2015-11-15 MED ORDER — MEPERIDINE HCL 25 MG/ML IJ SOLN: 6.2500 mg | INTRAMUSCULAR | Status: DC | PRN

## 2015-11-15 MED ORDER — KETOROLAC TROMETHAMINE 30 MG/ML IJ SOLN
INTRAMUSCULAR | Status: AC
  Filled 2015-11-15: qty 1

## 2015-11-15 MED ORDER — ROCURONIUM BROMIDE 100 MG/10ML IV SOLN
INTRAVENOUS | Status: DC | PRN
  Administered 2015-11-15: 35 mg via INTRAVENOUS
  Administered 2015-11-15: 10 mg via INTRAVENOUS

## 2015-11-15 MED ORDER — SUGAMMADEX SODIUM 200 MG/2ML IV SOLN
INTRAVENOUS | Status: DC | PRN
  Administered 2015-11-15: 174.2 mg via INTRAVENOUS

## 2015-11-15 MED ORDER — MIDAZOLAM HCL 2 MG/2ML IJ SOLN
INTRAMUSCULAR | Status: AC
  Filled 2015-11-15: qty 2

## 2015-11-15 MED ORDER — PROPOFOL 10 MG/ML IV BOLUS
INTRAVENOUS | Status: AC
  Filled 2015-11-15: qty 20

## 2015-11-15 MED ORDER — ONDANSETRON HCL 4 MG/2ML IJ SOLN: 4.0000 mg | Freq: Once | INTRAMUSCULAR | Status: DC | PRN

## 2015-11-15 MED ORDER — ONDANSETRON HCL 4 MG/2ML IJ SOLN
INTRAMUSCULAR | Status: DC | PRN
  Administered 2015-11-15: 4 mg via INTRAVENOUS

## 2015-11-15 MED ORDER — PROPOFOL 10 MG/ML IV BOLUS
INTRAVENOUS | Status: DC | PRN
  Administered 2015-11-15: 180 mg via INTRAVENOUS

## 2015-11-15 MED ORDER — KETOROLAC TROMETHAMINE 30 MG/ML IJ SOLN: 30.0000 mg | Freq: Once | INTRAMUSCULAR | Status: DC

## 2015-11-15 MED ORDER — FENTANYL CITRATE (PF) 250 MCG/5ML IJ SOLN
INTRAMUSCULAR | Status: AC
  Filled 2015-11-15: qty 5

## 2015-11-15 MED ORDER — LIDOCAINE-EPINEPHRINE (PF) 1 %-1:200000 IJ SOLN
INTRAMUSCULAR | Status: DC | PRN
  Administered 2015-11-15: 20 mL

## 2015-11-15 MED ORDER — ROCURONIUM BROMIDE 100 MG/10ML IV SOLN
INTRAVENOUS | Status: AC
  Filled 2015-11-15: qty 1

## 2015-11-15 MED ORDER — MIDAZOLAM HCL 2 MG/2ML IJ SOLN
INTRAMUSCULAR | Status: DC | PRN
  Administered 2015-11-15: 2 mg via INTRAVENOUS

## 2015-11-15 MED ORDER — BUPIVACAINE HCL (PF) 0.25 % IJ SOLN
INTRAMUSCULAR | Status: DC | PRN
  Administered 2015-11-15: 6 mL
  Administered 2015-11-15: 10 mL

## 2015-11-15 MED ORDER — OXYCODONE-ACETAMINOPHEN 5-325 MG PO TABS: 2.0000 | ORAL_TABLET | ORAL | Status: DC | PRN

## 2015-11-15 MED ORDER — LACTATED RINGERS IV SOLN
INTRAVENOUS | Status: DC
  Administered 2015-11-15 (×2): via INTRAVENOUS

## 2015-11-15 MED ORDER — IBUPROFEN 100 MG/5ML PO SUSP
200.0000 mg | Freq: Four times a day (QID) | ORAL | Status: DC | PRN
  Filled 2015-11-15: qty 20

## 2015-11-15 MED ORDER — FENTANYL CITRATE (PF) 100 MCG/2ML IJ SOLN: 25.0000 ug | INTRAMUSCULAR | Status: DC | PRN

## 2015-11-15 MED ORDER — ONDANSETRON HCL 4 MG/2ML IJ SOLN
INTRAMUSCULAR | Status: AC
  Filled 2015-11-15: qty 2

## 2015-11-15 MED ORDER — SCOPOLAMINE 1 MG/3DAYS TD PT72
MEDICATED_PATCH | TRANSDERMAL | Status: AC
  Administered 2015-11-15: 1.5 mg via TRANSDERMAL
  Filled 2015-11-15: qty 1

## 2015-11-15 MED ORDER — LIDOCAINE-EPINEPHRINE (PF) 1 %-1:200000 IJ SOLN
INTRAMUSCULAR | Status: AC
Start: 2015-11-15 — End: 2015-11-15
  Filled 2015-11-15: qty 30

## 2015-11-15 MED ORDER — LIDOCAINE HCL (CARDIAC) 20 MG/ML IV SOLN
INTRAVENOUS | Status: DC | PRN
  Administered 2015-11-15: 100 mg via INTRAVENOUS

## 2015-11-15 MED ORDER — DEXAMETHASONE SODIUM PHOSPHATE 4 MG/ML IJ SOLN
INTRAMUSCULAR | Status: AC
  Filled 2015-11-15: qty 1

## 2015-11-15 MED ORDER — HYDROCODONE-ACETAMINOPHEN 7.5-325 MG PO TABS: 1.0000 | ORAL_TABLET | Freq: Once | ORAL | Status: DC | PRN

## 2015-11-15 MED ORDER — IBUPROFEN 200 MG PO TABS
200.0000 mg | ORAL_TABLET | Freq: Four times a day (QID) | ORAL | Status: DC | PRN
  Filled 2015-11-15: qty 2

## 2015-11-15 MED ORDER — BUPIVACAINE HCL (PF) 0.25 % IJ SOLN
INTRAMUSCULAR | Status: AC
  Filled 2015-11-15: qty 30

## 2015-11-15 MED ORDER — SCOPOLAMINE 1 MG/3DAYS TD PT72
1.0000 | MEDICATED_PATCH | Freq: Once | TRANSDERMAL | Status: DC
  Administered 2015-11-15: 1.5 mg via TRANSDERMAL

## 2015-11-15 MED ORDER — SUGAMMADEX SODIUM 200 MG/2ML IV SOLN
INTRAVENOUS | Status: AC
  Filled 2015-11-15: qty 2

## 2015-11-15 MED ORDER — DEXAMETHASONE SODIUM PHOSPHATE 10 MG/ML IJ SOLN
INTRAMUSCULAR | Status: DC | PRN
  Administered 2015-11-15: 4 mg via INTRAVENOUS

## 2015-11-15 SURGICAL SUPPLY — 40 items
APPLICATOR ARISTA FLEXITIP XL (MISCELLANEOUS) ×4 IMPLANT
CANISTER SUCT 3000ML (MISCELLANEOUS) IMPLANT
CATH ROBINSON RED A/P 16FR (CATHETERS) ×4 IMPLANT
CLOTH BEACON ORANGE TIMEOUT ST (SAFETY) ×4 IMPLANT
CONT PATH 16OZ SNAP LID 3702 (MISCELLANEOUS) ×4 IMPLANT
DECANTER SPIKE VIAL GLASS SM (MISCELLANEOUS) ×4 IMPLANT
DILATOR CANAL MILEX (MISCELLANEOUS) IMPLANT
DRSG COVADERM PLUS 2X2 (GAUZE/BANDAGES/DRESSINGS) IMPLANT
DRSG OPSITE POSTOP 3X4 (GAUZE/BANDAGES/DRESSINGS) ×4 IMPLANT
GLOVE BIO SURGEON STRL SZ7 (GLOVE) ×4 IMPLANT
GLOVE BIOGEL PI IND STRL 7.0 (GLOVE) ×2 IMPLANT
GLOVE BIOGEL PI INDICATOR 7.0 (GLOVE) ×2
GOWN STRL REUS W/TWL LRG LVL3 (GOWN DISPOSABLE) ×8 IMPLANT
HEMOSTAT ARISTA ABSORB 3G PWDR (MISCELLANEOUS) ×4 IMPLANT
KIT BERKELEY 1ST TRIMESTER 3/8 (MISCELLANEOUS) ×8 IMPLANT
LIGASURE BLUNT 5MM 37CM (INSTRUMENTS) ×4 IMPLANT
LIQUID BAND (GAUZE/BANDAGES/DRESSINGS) ×4 IMPLANT
NEEDLE HYPO 22GX1.5 SAFETY (NEEDLE) IMPLANT
NEEDLE SPNL 22GX3.5 QUINCKE BK (NEEDLE) IMPLANT
NS IRRIG 1000ML POUR BTL (IV SOLUTION) ×4 IMPLANT
PACK LAPAROSCOPY BASIN (CUSTOM PROCEDURE TRAY) ×4 IMPLANT
PACK VAGINAL MINOR WOMEN LF (CUSTOM PROCEDURE TRAY) ×4 IMPLANT
PAD OB MATERNITY 4.3X12.25 (PERSONAL CARE ITEMS) ×4 IMPLANT
PAD PREP 24X48 CUFFED NSTRL (MISCELLANEOUS) ×4 IMPLANT
PAD TRENDELENBURG POSITION (MISCELLANEOUS) ×4 IMPLANT
POUCH SPECIMEN RETRIEVAL 10MM (ENDOMECHANICALS) ×4 IMPLANT
SET BERKELEY SUCTION TUBING (SUCTIONS) ×4 IMPLANT
SUT VIC AB 3-0 PS2 18 (SUTURE) ×2
SUT VIC AB 3-0 PS2 18XBRD (SUTURE) ×2 IMPLANT
SUT VICRYL 0 UR6 27IN ABS (SUTURE) ×8 IMPLANT
TOWEL OR 17X24 6PK STRL BLUE (TOWEL DISPOSABLE) ×8 IMPLANT
TROCAR BALLN 12MMX100 BLUNT (TROCAR) ×4 IMPLANT
TROCAR OPTI TIP 5M 100M (ENDOMECHANICALS) ×8 IMPLANT
VACURETTE 10 RIGID CVD (CANNULA) IMPLANT
VACURETTE 12 RIGID CVD (CANNULA) ×4 IMPLANT
VACURETTE 7MM CVD STRL WRAP (CANNULA) IMPLANT
VACURETTE 8 RIGID CVD (CANNULA) IMPLANT
VACURETTE 9 RIGID CVD (CANNULA) IMPLANT
WARMER LAPAROSCOPE (MISCELLANEOUS) ×4 IMPLANT
WATER STERILE IRR 1000ML POUR (IV SOLUTION) ×4 IMPLANT

## 2015-11-15 NOTE — H&P (Signed)
Tonya Olsen is an 35 y.o. female.   35 yo G3P2002 @ 11+6 weeks presents for desired pregnancy termination and bilateral laparoscopic salpingectomy for desired permanent sterilization. Current pregnancy was unplanned. Pts last pregnancy was affected by CPAM. Daughter is now 2 and doing well. Due to financial constraints, patient and husband can't financially support another child. Pt desires to proceed with pregnancy termination. R/B/A reviewed with the patient at length. 72 hour papers signed 11/03/15.  Patient's last menstrual period was 08/21/2015 (exact date).    Past Medical History  Diagnosis Date  . Depression   . Complication of anesthesia     TAKES A WHILE TO WAKE UP    Past Surgical History  Procedure Laterality Date  . Cesarean section      x 2  . Wisdom tooth extraction      History reviewed. No pertinent family history.  Social History:  reports that she has never smoked. She has never used smokeless tobacco. She reports that she does not drink alcohol or use illicit drugs.  Allergies: No Known Allergies  Prescriptions prior to admission  Medication Sig Dispense Refill Last Dose  . DULoxetine (CYMBALTA) 30 MG capsule Take 30 mg by mouth daily.  90 11/14/2015 at Unknown time  . ibuprofen (ADVIL,MOTRIN) 800 MG tablet Take 1 tablet (800 mg total) by mouth 3 (three) times daily. 21 tablet 0 Past Month at Unknown time  . traMADol (ULTRAM) 50 MG tablet Take 2 tablets (100 mg total) by mouth every 6 (six) hours as needed. (Patient not taking: Reported on 11/11/2015) 20 tablet 0 Completed Course at Unknown time    ROS  Blood pressure 117/80, pulse 100, temperature 98.2 F (36.8 C), temperature source Oral, resp. rate 20, height 5\' 2"  (1.575 m), weight 87.091 kg (192 lb), last menstrual period 08/21/2015, SpO2 100 %, unknown if currently breastfeeding. Physical Exam   AOX3, NAD Normal work of breathing Abd soft  No results found for this or any previous visit (from the  past 24 hour(s)).  No results found.  Assessment/Plan: 1) Proceed with Suction D&E and bilateral salpingectomoy 2) R/B/A reviewed 3) SCDs to OR  Tayquan Gassman H. 11/15/2015, 8:41 AM

## 2015-11-15 NOTE — Transfer of Care (Signed)
Immediate Anesthesia Transfer of Care Note  Patient: Tonya Olsen  Procedure(s) Performed: Procedure(s): DILATATION AND EVACUATION (N/A) LAPAROSCOPIC BILATERAL SALPINGECTOMY (Bilateral)  Patient Location: PACU  Anesthesia Type:General  Level of Consciousness: awake, alert  and oriented  Airway & Oxygen Therapy: Patient Spontanous Breathing and Patient connected to nasal cannula oxygen  Post-op Assessment: Report given to RN, Post -op Vital signs reviewed and stable and Patient moving all extremities  Post vital signs: Reviewed and stable  Last Vitals:  Filed Vitals:   11/15/15 0840  BP: 117/80  Pulse: 100  Temp: 36.8 C  Resp: 20    Last Pain: There were no vitals filed for this visit.    Patients Stated Pain Goal: 3 ([id number])  Complications: No apparent anesthesia complications

## 2015-11-15 NOTE — Op Note (Signed)
Pre-Operative Diagnosis: 1) pregnancy termination 2) desires permanent sterilization Postoperative diagnoses:1) pregnancy termination 2) desires permanent sterilization Procedure: suction dilation and evacuation and laparoscopic bilateral salpingectomy Surgeon: Dr. Vanessa Kick Asst.: Marylene Land, RNFA Operative findings: 12 week sized uterus, normal appearing ovaries and tubes. Corpus luteum noted on right ovary. A small adhesion of the omentum to the anterior abdominal wall was taken down Specimen: POCs, and bilateral tubes EBL: 100 cc  Procedure: Tonya Olsen is a 35 year old gravida 3 para 2002 at 11+6 who presents for elective pregnancy termination and bilateral salpingectomy. Please see the history and physical for complete details of the patient's history. Following the appropriate informed consent the patient was taken to the operating room. She was placed in the dorsal supine position and general anesthesia was administered. She was appropriately identified and a preoperative timeout procedure. She was then placed in the dorsal lithotomy position in [place] stirrups The abdomen perineum and vagina were prepped and draped in normal sterile fashion. A speculum was placed in the vagina. A single tooth tenaculum was placed on the anterior lip of the cervix and 20 cc of 1% lidocaine with 1-200,000 epinephrine paracervical block was injected. The cervix was serially dilated. A #12 suction curette was passed and that vacuum was engaged. The uterus was evacuated with 3 suction passes followed by a sharp curettage and a final suction pass. The single-tooth tenaculum was removed from the anterior lip of the cervix and a Hulka uterine manipulator was placed. The speculum was removed from the vagina. Gown and gloves were changed and attention was turned to the abdominal portion of the case. 10 cc of quarter percent Marcaine were injected infraumbilically. Allis clamps were used to elevate the abdominal skin and  a scalpel was used to make a semi-lunar incision inferior to the umbilicus. The subcutaneous tissue was dissected bluntly to the level of the fascia. The fascia was grasped with Coker clamps 2, elevated, and entered sharply with Mayo scissors. Abdominal entry was confirmed by direct visualization of the abdominal contents. A #1 Vicryl was used to secure the fascia. A Hassan port was placed and the abdomen was insufflated. A 5 mm port was placed in the right lower quadrant under direct visualization after 5 cc of quarter percent Marcaine was injected into the skin and a 5 mm skin incision was made with a scalpel. The same procedure was repeated on the left lower quadrant. The right fallopian tube was identified, elevated, and a 5 mm LigaSure was used to clamp, cut, and ligate the mesosalpinx. The fallopian tube was transected and placed in the anterior cul-de-sac. The same procedure was repeated on the left-hand side. A small area of bleeding was noted involving the mesial salpinx near the infundibulopelvic ligament on the left-hand side arista anticoagulant was applied to this area to achieve hemostasis to avoid thermal injury to the infundibular ligament. No additional bleeding was noted. This completed the surgical case. The right and left lower quadrant port sites were removed under direct visualization. The abdomen was desufflated. The [facility] port was removed and the umbilical fascia was secured with #1 Vicryl The skin was closed with 3-0 Vicryl subcuticular and skin glue. The right and left skin port sites were closed with skin glue. The Hulka uterine manipulator was removed from the cervix. All sponge, lap, needle counts were correct. The patient tolerated the procedure well and was transferred to the PACU following the procedure.

## 2015-11-15 NOTE — Anesthesia Procedure Notes (Signed)
Procedure Name: Intubation Date/Time: 11/15/2015 10:16 AM Performed by: Hewitt Blade Pre-anesthesia Checklist: Patient identified, Patient being monitored, Emergency Drugs available and Suction available Patient Re-evaluated:Patient Re-evaluated prior to inductionOxygen Delivery Method: Circle system utilized Preoxygenation: Pre-oxygenation with 100% oxygen Intubation Type: IV induction Ventilation: Mask ventilation without difficulty Laryngoscope Size: Mac and 3 Grade View: Grade I Tube type: Oral Tube size: 7.0 mm Number of attempts: 1 Airway Equipment and Method: Stylet Placement Confirmation: ETT inserted through vocal cords under direct vision,  positive ETCO2 and breath sounds checked- equal and bilateral Secured at: 21 cm Tube secured with: Tape Dental Injury: Teeth and Oropharynx as per pre-operative assessment

## 2015-11-15 NOTE — Discharge Instructions (Signed)

## 2015-11-15 NOTE — Anesthesia Postprocedure Evaluation (Signed)
Anesthesia Post Note  Patient: Tonya Olsen  Procedure(s) Performed: Procedure(s) (LRB): DILATATION AND EVACUATION (N/A) LAPAROSCOPIC BILATERAL SALPINGECTOMY (Bilateral)  Patient location during evaluation: PACU Anesthesia Type: General Level of consciousness: awake and alert Pain management: pain level controlled Vital Signs Assessment: post-procedure vital signs reviewed and stable Respiratory status: spontaneous breathing, nonlabored ventilation, respiratory function stable and patient connected to nasal cannula oxygen Cardiovascular status: blood pressure returned to baseline and stable Postop Assessment: no signs of nausea or vomiting Anesthetic complications: no     Last Vitals:  Filed Vitals:   11/15/15 1230 11/15/15 1245  BP: 117/71 121/79  Pulse: 109 103  Temp:    Resp: 16 16    Last Pain:  Filed Vitals:   11/15/15 1249  PainSc: 1    Pain Goal: Patients Stated Pain Goal: 3 (11/15/15 1245)               Montez Hageman

## 2015-11-15 NOTE — Anesthesia Preprocedure Evaluation (Signed)
Anesthesia Evaluation  Patient identified by MRN, date of birth, ID band Patient awake    Reviewed: Allergy & Precautions, H&P , NPO status , Patient's Chart, lab work & pertinent test results  Airway Mallampati: I  TM Distance: >3 FB Neck ROM: full    Dental no notable dental hx. (+) Teeth Intact   Pulmonary neg pulmonary ROS,    Pulmonary exam normal breath sounds clear to auscultation       Cardiovascular negative cardio ROS Normal cardiovascular exam     Neuro/Psych negative neurological ROS     GI/Hepatic negative GI ROS, Neg liver ROS,   Endo/Other  negative endocrine ROS  Renal/GU negative Renal ROS     Musculoskeletal   Abdominal Normal abdominal exam  (+) + obese,   Peds  Hematology negative hematology ROS (+)   Anesthesia Other Findings   Reproductive/Obstetrics negative OB ROS                             Anesthesia Physical Anesthesia Plan  ASA: II  Anesthesia Plan: General   Post-op Pain Management:    Induction: Intravenous  Airway Management Planned: Oral ETT  Additional Equipment:   Intra-op Plan:   Post-operative Plan: Extubation in OR  Informed Consent: I have reviewed the patients History and Physical, chart, labs and discussed the procedure including the risks, benefits and alternatives for the proposed anesthesia with the patient or authorized representative who has indicated his/her understanding and acceptance.   Dental Advisory Given  Plan Discussed with: CRNA and Surgeon  Anesthesia Plan Comments:         Anesthesia Quick Evaluation

## 2015-11-18 ENCOUNTER — Encounter (HOSPITAL_COMMUNITY): Payer: Self-pay | Admitting: Obstetrics and Gynecology

## 2017-04-12 ENCOUNTER — Other Ambulatory Visit: Payer: Self-pay | Admitting: Family Medicine

## 2017-04-12 ENCOUNTER — Ambulatory Visit
Admission: RE | Admit: 2017-04-12 | Discharge: 2017-04-12 | Disposition: A | Discharge status: Home / Self Care | Payer: BC Managed Care – PPO | Source: Ambulatory Visit | Attending: Family Medicine | Admitting: Family Medicine

## 2017-04-12 DIAGNOSIS — R1031 Right lower quadrant pain: Secondary | ICD-10-CM

## 2017-04-12 MED ORDER — IOPAMIDOL (ISOVUE-300) INJECTION 61%
100.0000 mL | Freq: Once | INTRAVENOUS | Status: AC | PRN
  Administered 2017-04-12: 100 mL via INTRAVENOUS

## 2017-04-13 ENCOUNTER — Ambulatory Visit (HOSPITAL_COMMUNITY): Payer: BC Managed Care – PPO | Admitting: Certified Registered Nurse Anesthetist

## 2017-04-13 ENCOUNTER — Encounter (HOSPITAL_COMMUNITY): Payer: Self-pay

## 2017-04-13 ENCOUNTER — Other Ambulatory Visit: Payer: Self-pay | Admitting: Obstetrics and Gynecology

## 2017-04-13 ENCOUNTER — Encounter (HOSPITAL_COMMUNITY)
Admission: AD | Disposition: A | Discharge status: Home / Self Care | Payer: Self-pay | Source: Ambulatory Visit | Attending: Obstetrics and Gynecology

## 2017-04-13 ENCOUNTER — Ambulatory Visit (HOSPITAL_COMMUNITY)
Admission: AD | Admit: 2017-04-13 | Discharge: 2017-04-13 | Disposition: A | Discharge status: Home / Self Care | Payer: BC Managed Care – PPO | Source: Ambulatory Visit | Attending: Obstetrics and Gynecology | Admitting: Obstetrics and Gynecology

## 2017-04-13 DIAGNOSIS — R1900 Intra-abdominal and pelvic swelling, mass and lump, unspecified site: Secondary | ICD-10-CM | POA: Insufficient documentation

## 2017-04-13 DIAGNOSIS — N83202 Unspecified ovarian cyst, left side: Secondary | ICD-10-CM | POA: Diagnosis present

## 2017-04-13 DIAGNOSIS — N801 Endometriosis of ovary: Secondary | ICD-10-CM | POA: Diagnosis not present

## 2017-04-13 LAB — CBC
HCT: 33.3 % — ABNORMAL LOW (ref 36.0–46.0)
Hemoglobin: 11 g/dL — ABNORMAL LOW (ref 12.0–15.0)
MCH: 27.8 pg (ref 26.0–34.0)
MCHC: 33 g/dL (ref 30.0–36.0)
MCV: 84.1 fL (ref 78.0–100.0)
PLATELETS: 297 10*3/uL (ref 150–400)
RBC: 3.96 MIL/uL (ref 3.87–5.11)
RDW: 12.9 % (ref 11.5–15.5)
WBC: 11.9 10*3/uL — AB (ref 4.0–10.5)

## 2017-04-13 LAB — TYPE AND SCREEN
ABO/RH(D): O POS
Antibody Screen: NEGATIVE

## 2017-04-13 SURGERY — OOPHORECTOMY, LAPAROSCOPIC
Anesthesia: General | Laterality: Left

## 2017-04-13 MED ORDER — PROPOFOL 10 MG/ML IV BOLUS
INTRAVENOUS | Status: AC
  Filled 2017-04-13: qty 20

## 2017-04-13 MED ORDER — FENTANYL CITRATE (PF) 100 MCG/2ML IJ SOLN
INTRAMUSCULAR | Status: AC
  Filled 2017-04-13: qty 2

## 2017-04-13 MED ORDER — FENTANYL CITRATE (PF) 100 MCG/2ML IJ SOLN
INTRAMUSCULAR | Status: DC | PRN
  Administered 2017-04-13: 75 ug via INTRAVENOUS
  Administered 2017-04-13: 100 ug via INTRAVENOUS
  Administered 2017-04-13 (×2): 50 ug via INTRAVENOUS
  Administered 2017-04-13: 25 ug via INTRAVENOUS

## 2017-04-13 MED ORDER — SUGAMMADEX SODIUM 200 MG/2ML IV SOLN
INTRAVENOUS | Status: AC
  Filled 2017-04-13: qty 2

## 2017-04-13 MED ORDER — ROCURONIUM BROMIDE 100 MG/10ML IV SOLN
INTRAVENOUS | Status: DC | PRN
  Administered 2017-04-13: 10 mg via INTRAVENOUS
  Administered 2017-04-13: 50 mg via INTRAVENOUS

## 2017-04-13 MED ORDER — LIDOCAINE HCL (CARDIAC) 20 MG/ML IV SOLN
INTRAVENOUS | Status: DC | PRN
  Administered 2017-04-13: 100 mg via INTRAVENOUS

## 2017-04-13 MED ORDER — CEFAZOLIN SODIUM-DEXTROSE 2-3 GM-%(50ML) IV SOLR
INTRAVENOUS | Status: AC
  Filled 2017-04-13: qty 50

## 2017-04-13 MED ORDER — SUGAMMADEX SODIUM 200 MG/2ML IV SOLN
INTRAVENOUS | Status: DC | PRN
  Administered 2017-04-13: 200 mg via INTRAVENOUS

## 2017-04-13 MED ORDER — MIDAZOLAM HCL 2 MG/2ML IJ SOLN
INTRAMUSCULAR | Status: DC | PRN
  Administered 2017-04-13: 2 mg via INTRAVENOUS

## 2017-04-13 MED ORDER — SCOPOLAMINE 1 MG/3DAYS TD PT72
1.0000 | MEDICATED_PATCH | Freq: Once | TRANSDERMAL | Status: DC
  Administered 2017-04-13: 1.5 mg via TRANSDERMAL

## 2017-04-13 MED ORDER — SCOPOLAMINE 1 MG/3DAYS TD PT72
MEDICATED_PATCH | TRANSDERMAL | Status: AC
  Administered 2017-04-13: 1.5 mg via TRANSDERMAL
  Filled 2017-04-13: qty 1

## 2017-04-13 MED ORDER — PROPOFOL 10 MG/ML IV BOLUS
INTRAVENOUS | Status: DC | PRN
  Administered 2017-04-13: 200 mg via INTRAVENOUS

## 2017-04-13 MED ORDER — DEXAMETHASONE SODIUM PHOSPHATE 4 MG/ML IJ SOLN
INTRAMUSCULAR | Status: DC | PRN
  Administered 2017-04-13: 4 mg via INTRAVENOUS

## 2017-04-13 MED ORDER — OXYCODONE-ACETAMINOPHEN 5-325 MG PO TABS
ORAL_TABLET | ORAL | Status: AC
  Filled 2017-04-13: qty 1

## 2017-04-13 MED ORDER — BUPIVACAINE HCL (PF) 0.25 % IJ SOLN
INTRAMUSCULAR | Status: AC
  Filled 2017-04-13: qty 30

## 2017-04-13 MED ORDER — OXYCODONE-ACETAMINOPHEN 5-325 MG PO TABS
1.0000 | ORAL_TABLET | ORAL | Status: DC | PRN
  Administered 2017-04-13: 1 via ORAL

## 2017-04-13 MED ORDER — ONDANSETRON HCL 4 MG/2ML IJ SOLN
INTRAMUSCULAR | Status: AC
  Filled 2017-04-13: qty 2

## 2017-04-13 MED ORDER — LACTATED RINGERS IV SOLN
INTRAVENOUS | Status: DC
  Administered 2017-04-13 (×2): via INTRAVENOUS

## 2017-04-13 MED ORDER — MIDAZOLAM HCL 2 MG/2ML IJ SOLN
INTRAMUSCULAR | Status: AC
  Filled 2017-04-13: qty 2

## 2017-04-13 MED ORDER — KETOROLAC TROMETHAMINE 30 MG/ML IJ SOLN
INTRAMUSCULAR | Status: DC | PRN
  Administered 2017-04-13: 30 mg via INTRAVENOUS

## 2017-04-13 MED ORDER — BUPIVACAINE HCL (PF) 0.25 % IJ SOLN
INTRAMUSCULAR | Status: DC | PRN
  Administered 2017-04-13: 15 mL

## 2017-04-13 MED ORDER — ONDANSETRON HCL 4 MG/2ML IJ SOLN
INTRAMUSCULAR | Status: DC | PRN
  Administered 2017-04-13: 4 mg via INTRAVENOUS

## 2017-04-13 MED ORDER — FENTANYL CITRATE (PF) 100 MCG/2ML IJ SOLN
25.0000 ug | INTRAMUSCULAR | Status: DC | PRN
  Administered 2017-04-13 (×3): 50 ug via INTRAVENOUS

## 2017-04-13 MED ORDER — OXYCODONE-ACETAMINOPHEN 5-325 MG PO TABS: 1.0000 | ORAL_TABLET | ORAL | 0 refills | Status: DC | PRN

## 2017-04-13 MED ORDER — KETOROLAC TROMETHAMINE 30 MG/ML IJ SOLN
INTRAMUSCULAR | Status: AC
  Filled 2017-04-13: qty 1

## 2017-04-13 MED ORDER — LACTATED RINGERS IV SOLN: INTRAVENOUS | Status: DC

## 2017-04-13 MED ORDER — DEXAMETHASONE SODIUM PHOSPHATE 4 MG/ML IJ SOLN
INTRAMUSCULAR | Status: AC
  Filled 2017-04-13: qty 1

## 2017-04-13 MED ORDER — CEFAZOLIN SODIUM-DEXTROSE 2-4 GM/100ML-% IV SOLN
2.0000 g | INTRAVENOUS | Status: AC
  Administered 2017-04-13: 2 g via INTRAVENOUS
  Filled 2017-04-13: qty 100

## 2017-04-13 MED ORDER — FENTANYL CITRATE (PF) 100 MCG/2ML IJ SOLN
INTRAMUSCULAR | Status: AC
  Administered 2017-04-13: 50 ug via INTRAVENOUS
  Filled 2017-04-13: qty 2

## 2017-04-13 MED ORDER — LIDOCAINE HCL (CARDIAC) 20 MG/ML IV SOLN
INTRAVENOUS | Status: AC
  Filled 2017-04-13: qty 5

## 2017-04-13 SURGICAL SUPPLY — 34 items
APPLICATOR ARISTA FLEXITIP XL (MISCELLANEOUS) IMPLANT
CABLE HIGH FREQUENCY MONO STRZ (ELECTRODE) IMPLANT
CANISTER SUCT 3000ML PPV (MISCELLANEOUS) ×3 IMPLANT
CONT PATH 16OZ SNAP LID 3702 (MISCELLANEOUS) IMPLANT
CONTAINER PREFILL 10% NBF 60ML (FORM) ×3 IMPLANT
DECANTER SPIKE VIAL GLASS SM (MISCELLANEOUS) ×3 IMPLANT
DERMABOND ADVANCED (GAUZE/BANDAGES/DRESSINGS) ×2
DERMABOND ADVANCED .7 DNX12 (GAUZE/BANDAGES/DRESSINGS) ×1 IMPLANT
DRSG OPSITE 4X5.5 SM (GAUZE/BANDAGES/DRESSINGS) ×3 IMPLANT
DRSG OPSITE POSTOP 3X4 (GAUZE/BANDAGES/DRESSINGS) ×3 IMPLANT
DURAPREP 26ML APPLICATOR (WOUND CARE) ×3 IMPLANT
GLOVE BIO SURGEON STRL SZ7 (GLOVE) ×3 IMPLANT
GLOVE BIOGEL PI IND STRL 7.0 (GLOVE) ×3 IMPLANT
GLOVE BIOGEL PI INDICATOR 7.0 (GLOVE) ×6
HEMOSTAT ARISTA ABSORB 3G PWDR (MISCELLANEOUS) IMPLANT
LIGASURE VESSEL 5MM BLUNT TIP (ELECTROSURGICAL) ×3 IMPLANT
NEEDLE HYPO 22GX1.5 SAFETY (NEEDLE) IMPLANT
NS IRRIG 1000ML POUR BTL (IV SOLUTION) ×3 IMPLANT
PACK LAPAROSCOPY BASIN (CUSTOM PROCEDURE TRAY) ×3 IMPLANT
PACK TRENDGUARD 450 HYBRID PRO (MISCELLANEOUS) ×1 IMPLANT
PACK TRENDGUARD 600 HYBRD PROC (MISCELLANEOUS) IMPLANT
POUCH LAPAROSCOPIC INSTRUMENT (MISCELLANEOUS) IMPLANT
POUCH SPECIMEN RETRIEVAL 10MM (ENDOMECHANICALS) ×3 IMPLANT
PROTECTOR NERVE ULNAR (MISCELLANEOUS) ×6 IMPLANT
SET IRRIG TUBING LAPAROSCOPIC (IRRIGATION / IRRIGATOR) ×3 IMPLANT
SUT VIC AB 3-0 PS2 18 (SUTURE) ×2
SUT VIC AB 3-0 PS2 18XBRD (SUTURE) ×1 IMPLANT
SUT VICRYL 0 UR6 27IN ABS (SUTURE) ×6 IMPLANT
TOWEL OR 17X24 6PK STRL BLUE (TOWEL DISPOSABLE) ×6 IMPLANT
TRENDGUARD 450 HYBRID PRO PACK (MISCELLANEOUS) ×3
TRENDGUARD 600 HYBRID PROC PK (MISCELLANEOUS)
TROCAR BALLN 12MMX100 BLUNT (TROCAR) ×3 IMPLANT
TROCAR OPTI TIP 5M 100M (ENDOMECHANICALS) ×6 IMPLANT
WARMER LAPAROSCOPE (MISCELLANEOUS) ×3 IMPLANT

## 2017-04-13 NOTE — Anesthesia Procedure Notes (Signed)
Procedure Name: Intubation Date/Time: 04/13/2017 3:16 PM Performed by: Lyndle Herrlich, MD Pre-anesthesia Checklist: Patient identified, Emergency Drugs available, Suction available and Patient being monitored Patient Re-evaluated:Patient Re-evaluated prior to induction Oxygen Delivery Method: Circle system utilized Preoxygenation: Pre-oxygenation with 100% oxygen Induction Type: IV induction Ventilation: Mask ventilation without difficulty Laryngoscope Size: Mac and 3 Grade View: Grade II Tube type: Oral Laser Tube: Cuffed inflated with minimal occlusive pressure - saline Tube size: 7.0 mm Number of attempts: 1 Airway Equipment and Method: Stylet Placement Confirmation: ETT inserted through vocal cords under direct vision,  positive ETCO2 and breath sounds checked- equal and bilateral Secured at: 21 cm Tube secured with: Tape Dental Injury: Teeth and Oropharynx as per pre-operative assessment

## 2017-04-13 NOTE — Discharge Instructions (Signed)

## 2017-04-13 NOTE — Transfer of Care (Signed)
Immediate Anesthesia Transfer of Care Note  Patient: Tonya Olsen  Procedure(s) Performed: LAPAROSCOPIC OVARIAN CYSTECTOMY (Left )  Patient Location: PACU  Anesthesia Type:General  Level of Consciousness: awake and sedated  Airway & Oxygen Therapy: Patient Spontanous Breathing and Patient connected to face mask oxygen  Post-op Assessment: Report given to RN and Post -op Vital signs reviewed and stable  Post vital signs: Reviewed and stable  Last Vitals:  Vitals:   04/13/17 1240  BP: 114/70  Pulse: (!) 107  Resp: 18  Temp: 37 C  SpO2: 100%    Last Pain:  Vitals:   04/13/17 1240  TempSrc: Oral  PainSc: 8       Patients Stated Pain Goal: 3 (79/02/40 9735)  Complications: No apparent anesthesia complications

## 2017-04-13 NOTE — H&P (Deleted)
  The note originally documented on this encounter has been moved the the encounter in which it belongs.  

## 2017-04-13 NOTE — H&P (Signed)
Admission H & P  CC: Abdominal pain  HPI: 36 yo G3P2012 presents for evaluation of abdominal pain and 13 cm pelvic mass. The patient states with onset of menstruation 2 days ago she had onset of severe abdominal pain. She presented to her PCP and was sent to have a CT of the abdomen and pelvis performed d/t rebound and guarding. CT showed a 13 cm mass. In the office today abdominal and pelvic ultrasound confirm presence of pelvic mass. Uterus is retroverted . Right ovary can be visualized. The cystic mass appears to be emanating from the left ovary. No normal appearing ovarian tissue is noted on the left. Visualized tissue appears to be edematous. No free fluid is noted in the pelvis making malignancy unlikely. Given size and pain it was recommended to proceed with diagnostic laparoscopy with possible laparotomy. If surgery confirms ovarian torsion then it is likely that the ovary is non viable. Will then proceed with oophorectomy. Discussed with patient that there is a very small possibility of malignancy and that intraabdominal spill could result in upstaging. Given the size of the mass, laparotomy may be required. R/B/A reviewed and the patient wishes to proceed  PE: Wt 217 BP: 110/78 AOX3, uncomfortable appearing Normal work of breathing Abd soft, tender with rebound and guarding  [place]: RV uterus with thin endometrium. ROV normal appearing. Midline cystic mass 13 x 10.4 x 12.5 cm No significant blood flow noted to cystic or solid components of mass. Likely represents left adnexa with torsion  A/P: Probably left ovarian torsion 1) Proceed with diagnostic laparoscopy, possible laparotomy, possible oophorectomy for pelvic mass with suspected left adnexal torsion.  2) WIll dose with 2 gm ancef pre-op in case conversion to laparotomy is required 3) SCDs  4) R/B/A reviewed and the patient wishes to proceed

## 2017-04-13 NOTE — Op Note (Signed)
Pre-Operative Diagnosis: 1) 13 cm pelvic mass 2) Suspected left ovarian torsion Postoperative Diagnosis:  1) 13 cm pelvic mass 2) Left ovarian torsion with hemorrhagic ovarian cyst Procedure: Laparoscopic left oophorectomy Surgeon: Dr. Vanessa Kick Assistant: Dr. Bobbye Charleston Anesthesia: Gen. Endotracheal anesthesia and 10 cc of 0.25% Marcaine injected infraumbilically. Operative Findings: Large 13 cm pelvic mass which was the left ovary with torsion.  The capsule of the ovary was smooth appearing.  There was no free fluid in the pelvis.  Upon inspecting the ovary torsion of the ovary at the level of the infundibula pelvic ligament was noted.  With manipulation of the ovary the cyst was punctured and approximately a liter of old blood was drained from the cyst cavity.  The ureters were visualized bilaterally.  The right ovary was normal-appearing the left and right tube are surgically absent.  The uterus was noted to be retroverted. Specimen:Left ovary WVP:XTGGY I/O In: 1900 [I.V.:1900] Out: 225 [Urine:200; Blood:25]  Procedure: Tonya Olsen is a 36 year old gravida 3 para 2012 who presented for evaluation of acute abdominal pain.  She was noted to have a 13 cm pelvic mass on abdominal CT.  A abdominal and vaginal ultrasound in the office characterized the pelvic mass as a left ovarian torsion.  The decision was made to proceed to the operating room for definitive surgical management.  Risks/benefits/alternatives of the surgical procedure were discussed with the patient at length including ramifications of not pursuing surgery.  The patient wished to pursue surgery.  Following the appropriate informed consent the patient was brought to the operating room.  She had general anesthesia administered and was then placed in the dorsal lithotomy position in [place].  The patient was appropriately identified in a preoperative timeout procedure.  The abdomen perineum and vagina were prepped and draped in  the normal sterile fashion.  A speculum was placed in the vagina and a Hulka uterine manipulator was placed.  Gloves were changed and the attention was turned to the abdominal portion of the procedure.  10 cc of quarter percent Marcaine was injected into the infraumbilical skin.  Allis clamps were used x2 to tent up the infra umbilical skin and a semilunar incision under the umbilicus was made.  A Claiborne Billings was used to bluntly dissect down to the level of the fascia.  The fascia was grasped with Coker clamps x2 tented up and entered sharply with the Mayo scissors and intra-abdominal entry was confirmed with direct visualization of the abdominal contents.  The [facility] port was then introduced and the abdomen was insufflated under direct visualization.  2 additional 5 mm ports were placed in the right lower quadrant and left lower quadrant under direct visualization.  Operative findings are as above.  With manipulation of the ovary the cyst ruptured resulting in spillage of port wine fluid into the abdomen.  This was suction irrigated.  The ureters were identified bilaterally.  Once the ovarian cyst was completely decompressed the ovary was untwisted at the level of the infundibula pelvic ligament.  The ovarian tissue was elevated and a LigaSure was used to divide the infundibulopelvic ligaments.  Successive bites were taken down the mesosalpinx to the level of the utero-ovarian ligament at the fundus.  This completely transected the ovarian attachments.  The camera was removed from the umbilical port a 5 mm camera was placed in the left lower quadrant port site and an Endobag was placed in the umbilical port the surgical specimen was placed in the Endobag under direct  visualization.  The Endobag was removed from the umbilical port and the specimen was removed.  The surgical site was then reinspected and found to be hemostatic.  The umbilical port site and right lower quadrant port site were inspected and found to be  hemostatic.  The left lower quadrant port site was removed.  The abdomen was desufflated.  The umbilical fascia was closed with #1 Vicryl in a figure-of-eight suture.  The umbilical skin was closed with 4-0 Vicryl in a subcuticular fashion and Dermabond.  The right and left lower quadrant port sites were closed with Dermabond.  All sponge, lap, needle counts were correct.  This completed the procedure.  The Hulka uterine manipulator was removed.  The patient was extubated in the operating room and transferred the PACU in stable condition

## 2017-04-13 NOTE — Brief Op Note (Signed)
04/13/2017  4:40 PM  PATIENT:  Tonya Olsen  36 y.o. female  PRE-OPERATIVE DIAGNOSIS:  Left Ovarian Torsion   POST-OPERATIVE DIAGNOSIS:  Hemorrhagic Left Ovarian Cyst with Ovarian Torsion  PROCEDURE:  Procedure(s) with comments: LAPAROSCOPIC OVARIAN CYSTECTOMY (Left) - left ovarian torsion  SURGEON:  Surgeon(s) and Role:    Vanessa Kick, MD - Primary    * Bobbye Charleston, MD - Assisting  ANESTHESIA:   general  EBL:  25 mL   BLOOD ADMINISTERED:none  DRAINS: Urinary Catheter (Foley)   LOCAL MEDICATIONS USED:  MARCAINE     SPECIMEN:  Source of Specimen:  Left ovary  DISPOSITION OF SPECIMEN:  PATHOLOGY  COUNTS:  YES  TOURNIQUET:  * No tourniquets in log *  DICTATION: .Dragon Dictation  PLAN OF CARE: Discharge to home after PACU  PATIENT DISPOSITION:  PACU - hemodynamically stable.   Delay start of Pharmacological VTE agent (>24hrs) due to surgical blood loss or risk of bleeding: not applicable

## 2017-04-14 NOTE — Anesthesia Postprocedure Evaluation (Signed)
Anesthesia Post Note  Patient: Tonya Olsen  Procedure(s) Performed: LAPAROSCOPIC LEFT OOPHORECTOMY AND REMOVAL OF CYST (Left )     Patient location during evaluation: PACU Anesthesia Type: General Level of consciousness: awake and alert Pain management: pain level controlled Vital Signs Assessment: post-procedure vital signs reviewed and stable Respiratory status: spontaneous breathing, nonlabored ventilation, respiratory function stable and patient connected to nasal cannula oxygen Cardiovascular status: blood pressure returned to baseline and stable Postop Assessment: no apparent nausea or vomiting Anesthetic complications: no    Last Vitals:  Vitals:   04/13/17 1821 04/13/17 1905  BP: 108/70 112/63  Pulse: (!) 107 (!) 104  Resp: 18 18  Temp:    SpO2:  96%    Last Pain:  Vitals:   04/13/17 1815  TempSrc:   PainSc: 3    Pain Goal: Patients Stated Pain Goal: 3 (04/13/17 1240)               Lyndle Herrlich EDWARD

## 2017-04-14 NOTE — Anesthesia Preprocedure Evaluation (Signed)
Anesthesia Evaluation  Patient identified by MRN, date of birth, ID band Patient awake    Reviewed: Allergy & Precautions, H&P , Patient's Chart, lab work & pertinent test results  Airway Mallampati: II  TM Distance: >3 FB Neck ROM: full    Dental no notable dental hx.    Pulmonary    Pulmonary exam normal breath sounds clear to auscultation       Cardiovascular Exercise Tolerance: Good  Rhythm:regular Rate:Normal     Neuro/Psych    GI/Hepatic   Endo/Other    Renal/GU      Musculoskeletal   Abdominal   Peds  Hematology   Anesthesia Other Findings   Reproductive/Obstetrics                             Anesthesia Physical Anesthesia Plan  ASA: II  Anesthesia Plan: General   Post-op Pain Management:    Induction: Intravenous  PONV Risk Score and Plan:   Airway Management Planned: Oral ETT  Additional Equipment:   Intra-op Plan:   Post-operative Plan: Extubation in OR  Informed Consent: I have reviewed the patients History and Physical, chart, labs and discussed the procedure including the risks, benefits and alternatives for the proposed anesthesia with the patient or authorized representative who has indicated his/her understanding and acceptance.     Plan Discussed with:   Anesthesia Plan Comments: (  )        Anesthesia Quick Evaluation

## 2018-05-07 ENCOUNTER — Ambulatory Visit (INDEPENDENT_AMBULATORY_CARE_PROVIDER_SITE_OTHER): Payer: BC Managed Care – PPO | Admitting: Psychiatry

## 2018-05-07 ENCOUNTER — Telehealth: Payer: Self-pay | Admitting: Psychiatry

## 2018-05-07 DIAGNOSIS — F332 Major depressive disorder, recurrent severe without psychotic features: Secondary | ICD-10-CM

## 2018-05-07 MED ORDER — SERTRALINE HCL 50 MG PO TABS: ORAL_TABLET | ORAL | 0 refills | Status: DC
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
## 2018-05-09 NOTE — Telephone Encounter (Signed)
No note needed 

## 2018-05-21 ENCOUNTER — Ambulatory Visit (INDEPENDENT_AMBULATORY_CARE_PROVIDER_SITE_OTHER): Payer: BC Managed Care – PPO | Admitting: Psychiatry

## 2018-05-21 DIAGNOSIS — F329 Major depressive disorder, single episode, unspecified: Secondary | ICD-10-CM | POA: Diagnosis not present

## 2018-05-21 DIAGNOSIS — R45851 Suicidal ideations: Secondary | ICD-10-CM | POA: Diagnosis not present

## 2018-05-21 DIAGNOSIS — F411 Generalized anxiety disorder: Secondary | ICD-10-CM

## 2018-05-21 DIAGNOSIS — F32A Depression, unspecified: Secondary | ICD-10-CM

## 2018-05-21 MED ORDER — LITHIUM CARBONATE 300 MG PO TABS: ORAL_TABLET | ORAL | 0 refills | Status: DC
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
## 2018-05-22 DIAGNOSIS — Z0289 Encounter for other administrative examinations: Secondary | ICD-10-CM

## 2018-05-23 ENCOUNTER — Ambulatory Visit: Payer: BC Managed Care – PPO | Admitting: Psychiatry

## 2018-05-27 ENCOUNTER — Encounter: Payer: Self-pay | Admitting: Mental Health

## 2018-05-27 ENCOUNTER — Ambulatory Visit (INDEPENDENT_AMBULATORY_CARE_PROVIDER_SITE_OTHER): Payer: BC Managed Care – PPO | Admitting: Mental Health

## 2018-05-27 DIAGNOSIS — F331 Major depressive disorder, recurrent, moderate: Secondary | ICD-10-CM | POA: Diagnosis not present
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
## 2018-05-31 ENCOUNTER — Other Ambulatory Visit: Payer: Self-pay | Admitting: Psychiatry

## 2018-06-03 NOTE — Telephone Encounter (Signed)
rx called into pharmacy

## 2018-06-06 ENCOUNTER — Ambulatory Visit: Payer: BC Managed Care – PPO | Admitting: Psychiatry

## 2018-06-06 DIAGNOSIS — F411 Generalized anxiety disorder: Secondary | ICD-10-CM | POA: Diagnosis not present

## 2018-06-06 DIAGNOSIS — F3341 Major depressive disorder, recurrent, in partial remission: Secondary | ICD-10-CM | POA: Diagnosis not present

## 2018-06-06 MED ORDER — PAROXETINE HCL 20 MG PO TABS: ORAL_TABLET | ORAL | 1 refills | Status: DC

## 2018-06-06 NOTE — Progress Notes (Signed)
[facility]  Patient ID: Tonya Olsen,  MRN: [id number]  PCP: Pa, [place]  Date of Evaluation: 06/06/2018 Time spent:20 minutes  Chief Complaint:   HISTORY/CURRENT STATUS: HPI patient has diagnosis of depression and anxiety.  She was last seen 05/21/2018.  At that time her depression and anxiety were worse.  Started her on lithium 300 mg a day for a week and then 600 mg a day.  Will order a lithium level today.  She we continued her Zoloft 50 mg a day as she is only been on this several weeks. At this time her depression is much better.  Anxiety is only slightly better.  Denies suicidal thoughts.  Has not started the lithium as she was having side effects on the Zoloft.  Individual Medical History/ Review of Systems: Changes? :No   Allergies: Patient has no known allergies.  Current Medications:  Current Outpatient Medications:  .  sertraline (ZOLOFT) 50 MG tablet, 1TABLET DAILY, Disp: 90 tablet, Rfl: 0 .  ibuprofen (ADVIL,MOTRIN) 800 MG tablet, Take 1 tablet (800 mg total) by mouth 3 (three) times daily., Disp: 21 tablet, Rfl: 0 .  lithium 300 MG tablet, 1 tab hs for a week, then 2 tabs hs (Patient not taking: Reported on 06/06/2018), Disp: 60 tablet, Rfl: 0 .  oxyCODONE-acetaminophen (ROXICET) 5-325 MG tablet, Take 1-2 tablets by mouth every 4 (four) hours as needed., Disp: 20 tablet, Rfl: 0 .  PARoxetine (PAXIL) 20 MG tablet, 1/2 tab/day for a week, then 1 whole tab/day, Disp: 30 tablet, Rfl: 1 .  tranexamic acid (LYSTEDA) 650 MG TABS tablet, Take 1,300 mg by mouth 3 (three) times daily., Disp: , Rfl:  Medication Side Effects: Decreased concentration, cannot finish her thoughts or conversation.  Tired.  Family Medical/ Social History: Changes? No  MENTAL HEALTH EXAM:  There were no vitals taken for this visit.There is no height or weight on file to calculate BMI.  General Appearance: Casual  Eye Contact:  Good  Speech:  Clear and Coherent   Volume:  Normal  Mood:  Anxious  Affect:  Appropriate  Thought Process:  Linear  Orientation:  Full (Time, Place, and Person)  Thought Content: Logical   Suicidal Thoughts:  No  Homicidal Thoughts:  No  Memory:  WNL  Judgement:  Good  Insight:  Good  Psychomotor Activity:  Normal  Concentration:  Concentration: Good  Recall:  Good  Fund of Knowledge: Good  Language: Good  Assets:  Desire for Improvement  ADL's:  Intact  Cognition: WNL  Prognosis:  Good    DIAGNOSES:    ICD-10-CM   1. Recurrent major depressive disorder, in partial remission ([place]) F33.41   2. Anxiety state F41.1     Receiving Psychotherapy: No    RECOMMENDATIONS: Patient will start Paxil 20 mg 1/2 tablet a day for a week and then 1 whole tablet.  She is to taper off her Zoloft 50 mg she is to take 50 mg a day for a week then 25 mg a day for a week and then stop hold on starting the lithium    [employer], PA-C

## 2018-06-10 ENCOUNTER — Telehealth: Payer: Self-pay | Admitting: Psychiatry

## 2018-06-10 ENCOUNTER — Other Ambulatory Visit: Payer: Self-pay | Admitting: Psychiatry

## 2018-06-10 MED ORDER — SERTRALINE HCL 25 MG PO TABS: ORAL_TABLET | ORAL | 0 refills | Status: DC

## 2018-06-10 NOTE — Telephone Encounter (Signed)
Submitted

## 2018-06-10 NOTE — Telephone Encounter (Signed)
Patient requesting refill of 10 pills of Sertraline. Stated per conversation with Lissa Hoard if she needed them to notify the office. Please fill at the [facility] on [address]

## 2018-06-13 ENCOUNTER — Ambulatory Visit: Payer: BC Managed Care – PPO | Admitting: Mental Health

## 2018-06-14 ENCOUNTER — Encounter: Payer: Self-pay | Admitting: Mental Health

## 2018-06-14 ENCOUNTER — Ambulatory Visit: Payer: BC Managed Care – PPO | Admitting: Mental Health

## 2018-06-14 DIAGNOSIS — F331 Major depressive disorder, recurrent, moderate: Secondary | ICD-10-CM

## 2018-06-14 DIAGNOSIS — F411 Generalized anxiety disorder: Secondary | ICD-10-CM | POA: Diagnosis not present
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
## 2018-06-15 ENCOUNTER — Other Ambulatory Visit: Payer: Self-pay | Admitting: Psychiatry

## 2018-06-28 ENCOUNTER — Ambulatory Visit: Payer: BC Managed Care – PPO | Admitting: Psychiatry

## 2018-07-03 ENCOUNTER — Other Ambulatory Visit: Payer: Self-pay | Admitting: Psychiatry

## 2018-07-03 NOTE — Telephone Encounter (Signed)
Needs to schedule follow up;

## 2018-07-04 ENCOUNTER — Ambulatory Visit: Payer: BC Managed Care – PPO | Admitting: Mental Health

## 2018-07-04 ENCOUNTER — Encounter: Payer: Self-pay | Admitting: Mental Health

## 2018-07-04 DIAGNOSIS — F331 Major depressive disorder, recurrent, moderate: Secondary | ICD-10-CM
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
## 2018-07-08 ENCOUNTER — Ambulatory Visit: Payer: BC Managed Care – PPO | Admitting: Psychiatry

## 2018-07-08 DIAGNOSIS — F331 Major depressive disorder, recurrent, moderate: Secondary | ICD-10-CM

## 2018-07-08 MED ORDER — PAROXETINE HCL 20 MG PO TABS: ORAL_TABLET | ORAL | 0 refills | Status: DC

## 2018-07-08 MED ORDER — ALPRAZOLAM 0.5 MG PO TABS: ORAL_TABLET | ORAL | 0 refills | Status: DC
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
## 2018-07-22 ENCOUNTER — Ambulatory Visit: Payer: BC Managed Care – PPO | Admitting: Mental Health

## 2018-07-22 ENCOUNTER — Other Ambulatory Visit: Payer: Self-pay

## 2018-07-22 DIAGNOSIS — F331 Major depressive disorder, recurrent, moderate: Secondary | ICD-10-CM
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
## 2018-07-24 ENCOUNTER — Telehealth: Payer: Self-pay | Admitting: Psychiatry

## 2018-07-24 NOTE — Telephone Encounter (Signed)
Is taking Trintellix. She is not liking results, has side effects of night sweats, cloudy thinking, and very drowsy- currently on 10mg .  Wants to go off of the Trintellix.

## 2018-08-05 ENCOUNTER — Ambulatory Visit (INDEPENDENT_AMBULATORY_CARE_PROVIDER_SITE_OTHER): Payer: BC Managed Care – PPO | Admitting: Mental Health

## 2018-08-05 ENCOUNTER — Ambulatory Visit: Payer: BC Managed Care – PPO | Admitting: Psychiatry

## 2018-08-05 ENCOUNTER — Ambulatory Visit: Payer: BC Managed Care – PPO | Admitting: Mental Health

## 2018-08-05 ENCOUNTER — Encounter: Payer: Self-pay | Admitting: Mental Health

## 2018-08-05 DIAGNOSIS — F331 Major depressive disorder, recurrent, moderate: Secondary | ICD-10-CM | POA: Diagnosis not present

## 2018-08-05 NOTE — Progress Notes (Signed)
[facility] Counselor/Therapist Progress Note  Patient ID: MEOSHIA BILLING, MRN: [id number],    Date: 08/05/2018   I connected with patient by a video enabled telemedicine application or telephone, with their informed consent, and verified patient privacy and that I am speaking with the correct person using two identifiers.  I was located a t home and patient at home. Due to [facility] Virus Pandemic.   Time Spent: 30 minutes  Treatment Type: Individual Therapy  Reported Symptoms: Improved level of depression. Sleeping better. More rested. Polite and cooperative. Not agitated and tense since she has had break from problematic classroom/  Mental Status Exam:  Appearance:   unseen - telephone session     Behavior:  Appropriate and sharing. Unseen due to telephone session.  Motor:  unseen  Speech/Language:   Clear and Coherent  Affect:  Full Range  Mood:  euthymic  Thought process:  normal  Thought content:    WNL  Sensory/Perceptual disturbances:    WNL  Orientation:  oriented to person, place and time/date  Attention:  Good  Concentration:  Good  Memory:  WNL  Fund of knowledge:   Good  Insight:    Good  Judgment:   Good  Impulse Control:  Good   Risk Assessment: Danger to Self:  No Self-injurious Behavior: No Danger to Others: No Duty to Warn:no Physical Aggression / Violence:No  Access to Firearms a concern: No  Gang Involvement:No   Subjective:  At home due to Pandemic. Much calmer and less depressed and agitated since out of school for extended period. Husband is out of  School on paid administrative leave. More rested. Able to keep son and daughter on schedule during the day. Family life is improving.   Interventions: Cognitive Behavioral Therapy, Solution-Oriented/Positive Psychology, Insight-Oriented and Interpersonal  Diagnosis: F 33.1     Treatment Plan   Patient Name: Hadasah Brugger   Date: August 05, 2018   Didactic topic to be discussed:        Anxiety:                   Locus of control                              Work/Life balance           Depression                             Problem-solving                              Relationships                                   Boundaries                                     Coping srategies                             Communication                    Recovery from trauma  Self-care                                     Validation  Other     Goals:  Patient  1. Maintains mood stabiity:  decreased symptoms of     depression     anxiety  2.   Practices pro-active self-care:   restful sleep, nutrition, exercise, socialization  3.   Effective utilizes boundaries and sets limits  4.   Utliizes coping strategies and problem solving techniques for stress management  5.   Feels accurately heard, understood and validated  Other: Job search      Logan Bores [facility]

## 2018-08-21 NOTE — Telephone Encounter (Signed)
I was just able to get access to [facility] in basket and will be managing it.  From looking at the chart I do not see that this was ever responded to.  At your convenience we please contact the patient and see what she needs at this point.  If she needs a to stop the medication she can do so abruptly because Trintellix has a long half-life.  If she needs a new medication she will need to schedule an appointment with either Helene Kelp or me.

## 2018-08-21 NOTE — Telephone Encounter (Signed)
Spoke with pt and she is doing well, she did stop her Trintellix but is taking other medications prescribed by Lissa Hoard. She said she will call back and set up an appt once things settle down with another provider. Very sorry to here about the situation and was appreciative of the phone call.

## 2018-09-05 ENCOUNTER — Encounter: Payer: Self-pay | Admitting: Mental Health

## 2018-09-05 ENCOUNTER — Ambulatory Visit (INDEPENDENT_AMBULATORY_CARE_PROVIDER_SITE_OTHER): Payer: BC Managed Care – PPO | Admitting: Mental Health

## 2018-09-05 ENCOUNTER — Other Ambulatory Visit: Payer: Self-pay

## 2018-09-05 DIAGNOSIS — F331 Major depressive disorder, recurrent, moderate: Secondary | ICD-10-CM

## 2018-09-05 NOTE — Progress Notes (Signed)
[facility] Counselor/Therapist Progress Note  Patient ID: Tonya Olsen, MRN: [id number],    Date: 09/05/2018   I connected with patient by a video enabled telemedicine application or telephone, with their informed consent, and verified patient privacy and that I am speaking with the correct person using two identifiers.  I was located at home and patient at home. [place] Virus Pandemic. 2:00 PM   Time Spent: 45 Minutes  Treatment Type: Individual Therapy  Reported Symptoms: Has been anxious about returning to school but relieved that school will not resume this school year. Also living in a lot of uncertainty about husband's job.  Sleeping "like a baby." Concerned about needing to lose weight.  Mental Status Exam:  Appearance:   unseen     Behavior:  Appropriate  Motor:  Normal  Speech/Language:   Clear and Coherent  Affect:  Appropriate  Mood:  anxious  Thought process:  normal  Thought content:    WNL  Sensory/Perceptual disturbances:    WNL  Orientation:  oriented to person, place and time/date  Attention:  Good  Concentration:  Good  Memory:  WNL  Fund of knowledge:   good  Insight:    Good  Judgment:   Good  Impulse Control:  Good   Risk Assessment: Danger to Self:  No Self-injurious Behavior: No Danger to Others: No Duty to Warn:no Physical Aggression / Violence:No  Access to Firearms a concern: No  Gang Involvement:No   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 Unresolved issues involving husband's teaching job. Since he is a transplant recipient, they have had to live under a lot of caution during this period. Children interacting well. Parents drive by or stand in the yard and visit occasionally.   Interventions: Solution-Oriented/Positive Psychology, Insight-Oriented, Interpersonal and supportice  Diagnosis:  Major Depression    Treatment Plan   Patient Name: Tonya Olsen   Date:  April 30,2020   Didactic topic to be discussed:           Anxiety:                   Locus of control                              Work/Life balance           Depression                             Problem-solving                              Relationships                                   Boundaries                                     Coping srategies                             Communication  Recovery from trauma                    Self-care                                     Validation  Other     Goals:  Patient  1. Maintains mood stabiity:  decreased symptoms of     depression     anxiety  2.   Practices pro-active self-care:   restful sleep, nutrition, exercise, socialization  3.   Effective utilizes boundaries and sets limits  4.   Utliizes coping strategies and problem solving techniques for stress management  5.   Feels accurately heard, understood and validated  Other: Employment issues are resolved      Logan Bores [place], [facility]

## 2018-09-26 ENCOUNTER — Ambulatory Visit: Payer: BC Managed Care – PPO | Admitting: Mental Health

## 2018-10-11 ENCOUNTER — Other Ambulatory Visit: Payer: Self-pay

## 2018-10-11 ENCOUNTER — Ambulatory Visit: Payer: BC Managed Care – PPO | Admitting: Psychiatry

## 2018-10-11 ENCOUNTER — Encounter: Payer: Self-pay | Admitting: Psychiatry

## 2018-10-11 DIAGNOSIS — F329 Major depressive disorder, single episode, unspecified: Secondary | ICD-10-CM

## 2018-10-11 DIAGNOSIS — F32A Depression, unspecified: Secondary | ICD-10-CM

## 2018-10-11 MED ORDER — BUPROPION HCL ER (XL) 150 MG PO TB24: 150.0000 mg | ORAL_TABLET | Freq: Every day | ORAL | 1 refills | Status: DC

## 2018-10-11 MED ORDER — PAROXETINE HCL 20 MG PO TABS: 20.0000 mg | ORAL_TABLET | Freq: Every day | ORAL | 1 refills | Status: DC

## 2018-10-11 NOTE — Progress Notes (Signed)
Tonya Olsen [id number] Jan 25, 1981 38 y.o.  Subjective:   Patient ID:  Tonya Olsen is a 38 y.o. (DOB 05/18/1980) female.  Chief Complaint:  Chief Complaint  Patient presents with  . Depression    HPI Tonya Olsen presents to the office today for follow-up of depression and anxiety. Patient is a previous patient of [place] [place], [place].  She reports that she started seeing [place], [place] in December 2019 in the context of severe situational stress and anxiety, along with an exacerbation of depression at that time.  She reports h/o depression "for years. Sometimes it is worse than others." She reports that she has had recent significant stress and "for the first time in my life I had anxiety."    She reports that Paxil has been helpful with depression and not experiencing "brain fog." She reports that she has depression around her menstrual cycle. Reports that she will withdrawal and sleep more around her menstrual cycle. She reports "during the rest of my month, 'I'm fine.' " She reports that she no longer has anxiety and thinks some of this is situational with decreased stressors. Has rarely needed Xanax prn. Estimates sleeping 8 hours at night and a couple of naps daily. Estimates sleeping 10-12 hours in a 24-hour period some days. Denies any change in appetite. Reports that energy is ok. She reports that motivation is ok. Reports concentration is adequate. Enjoying family and other activities.   Denies SI.   Reports that she had significant work stress. Her husband has also had significant work stress. Has 2 children, ages 22 (son) and 27 (daugter). Family has been quarantined together.   Past Psychiatric Medication Trials: Cymbalta- Worked well for a year and then started having night sweats and felt "in a fog" and had recurrence of depression and stopped it in Summer 2018.  Paxil- Helpful with Depression. Has had sexual side effects. Has had night sweats.  Sertraline- "brain fog"  and felt slowed.  Trintellix- Side effects Lithium- Did not take since Depression improved.  Xanax   Review of Systems:  Review of Systems  Musculoskeletal: Negative for gait problem.  Skin: Positive for rash.       Reports rash since last July  Neurological: Negative for tremors.  Psychiatric/Behavioral:       Please refer to HPI    Medications: I have reviewed the patient's current medications.  Current Outpatient Medications  Medication Sig Dispense Refill  . ALPRAZolam (XANAX) 0.5 MG tablet 1/2 to 1 prn anxiety. Limit 2 pills in a day 30 tablet 0  . PARoxetine (PAXIL) 20 MG tablet Take 1 tablet (20 mg total) by mouth daily for 30 days. 30 tablet 1  . buPROPion (WELLBUTRIN XL) 150 MG 24 hr tablet Take 1 tablet (150 mg total) by mouth daily for 30 days. 30 tablet 1   No current facility-administered medications for this visit.     Medication Side Effects: Other: Night sweats, sexual side effects  Allergies: No Known Allergies  Past Medical History:  Diagnosis Date  . Complication of anesthesia    TAKES A WHILE TO WAKE UP  . Depression     History reviewed. No pertinent family history.  Social History   Socioeconomic History  . Marital status: Married    Spouse name: Not on file  . Number of children: Not on file  . Years of education: Not on file  . Highest education level: Not on file  Occupational History  . Not on  file  Social Needs  . Financial resource strain: Not on file  . Food insecurity:    Worry: Not on file    Inability: Not on file  . Transportation needs:    Medical: Not on file    Non-medical: Not on file  Tobacco Use  . Smoking status: Never Smoker  . Smokeless tobacco: Never Used  Substance and Sexual Activity  . Alcohol use: No  . Drug use: No  . Sexual activity: Yes    Partners: Male    Birth control/protection: None    Comment: married  Lifestyle  . Physical activity:    Days per week: Not on file    Minutes per session: Not  on file  . Stress: Not on file  Relationships  . Social connections:    Talks on phone: Not on file    Gets together: Not on file    Attends religious service: Not on file    Active member of club or organization: Not on file    Attends meetings of clubs or organizations: Not on file    Relationship status: Not on file  . Intimate partner violence:    Fear of current or ex partner: Not on file    Emotionally abused: Not on file    Physically abused: Not on file    Forced sexual activity: Not on file  Other Topics Concern  . Not on file  Social History Narrative  . Not on file    Past Medical History, Surgical history, Social history, and Family history were reviewed and updated as appropriate.   Please see review of systems for further details on the patient's review from today.   Objective:   Physical Exam:  There were no vitals taken for this visit.  Physical Exam Constitutional:      General: She is not in acute distress.    Appearance: She is well-developed.  Musculoskeletal:        General: No deformity.  Neurological:     Mental Status: She is alert and oriented to person, place, and time.     Coordination: Coordination normal.  Psychiatric:        Mood and Affect: Mood is not anxious. Affect is not labile, blunt, angry or inappropriate.        Speech: Speech normal.        Behavior: Behavior normal.        Thought Content: Thought content normal. Thought content does not include homicidal or suicidal ideation. Thought content does not include homicidal or suicidal plan.        Judgment: Judgment normal.     Comments: Dysthymic mood. Insight intact. No auditory or visual hallucinations. No delusions.      Lab Review:     Component Value Date/Time   NA 140 06/14/2015 1400   K 4.1 06/14/2015 1400   CL 106 06/14/2015 1400   CO2 25 06/14/2015 1400   GLUCOSE 106 (H) 06/14/2015 1400   BUN 9 06/14/2015 1400   CREATININE 0.61 06/14/2015 1400   CREATININE  0.74 06/25/2011 1144   CALCIUM 9.1 06/14/2015 1400   PROT 7.6 06/14/2015 1400   ALBUMIN 4.3 06/14/2015 1400   AST 21 06/14/2015 1400   ALT 28 06/14/2015 1400   ALKPHOS 101 06/14/2015 1400   BILITOT 0.5 06/14/2015 1400   GFRNONAA >60 06/14/2015 1400   GFRAA >60 06/14/2015 1400       Component Value Date/Time   WBC 11.9 (H) 04/13/2017 1230  RBC 3.96 04/13/2017 1230   HGB 11.0 (L) 04/13/2017 1230   HCT 33.3 (L) 04/13/2017 1230   PLT 297 04/13/2017 1230   MCV 84.1 04/13/2017 1230   MCV 89.1 06/25/2011 1144   MCH 27.8 04/13/2017 1230   MCHC 33.0 04/13/2017 1230   RDW 12.9 04/13/2017 1230   LYMPHSABS 1.0 03/04/2010 0349   MONOABS 1.0 03/04/2010 0349   EOSABS 0.2 03/04/2010 0349   BASOSABS 0.0 03/04/2010 0349    No results found for: POCLITH, LITHIUM   No results found for: PHENYTOIN, PHENOBARB, VALPROATE, CBMZ   .res Assessment: Plan:   Discussed several treatment options with patient since patient reports that overall Paxil has been more effective and better tolerated compared to other medications that she has taken in the past, however she is experiencing sexual side effects and sweating. Discussed potential benefits, risks, and side effects of Wellbutrin XL for depression.  Discussed that Wellbutrin has a different mechanism of action than other antidepressants she has taken in the past and specifically targets low energy, low motivation and impaired concentration which she reports have been significant symptoms for her.  Discussed that Wellbutrin is also not associated with sexual side effects and may potentially offset sexual side effects of Paxil.  Discussed that Wellbutrin XL could be continued in combination with Paxil, or potentially with lower dose of Paxil or as monotherapy.  Patient agrees to trial of Wellbutrin XL. Will start Wellbutrin XL 150 mg p.o. every morning for depression. Will continue Paxil 20 mg daily for anxiety and depression. Patient to follow-up in 4  weeks or sooner if clinically indicated. Patient advised to contact office with any questions, adverse effects, or acute worsening in signs and symptoms.  Depression, unspecified depression type - Plan: buPROPion (WELLBUTRIN XL) 150 MG 24 hr tablet, PARoxetine (PAXIL) 20 MG tablet  Please see After Visit Summary for patient specific instructions.  Future Appointments  Date Time Provider [place]  11/11/2018  9:30 AM Thayer Headings, PMHNP CP-[facility] None    No orders of the defined types were placed in this encounter.     -------------------------------

## 2018-10-18 ENCOUNTER — Telehealth: Payer: Self-pay | Admitting: Psychiatry

## 2018-10-18 NOTE — Telephone Encounter (Signed)
Pt taking Bupropion XL. Stated she is experiencing an allergic reaction possible. Legs and abdominal red and covered in hive 10hrs after taking medication. Currently taking Benadryl with some relief. Please advise

## 2018-10-21 NOTE — Telephone Encounter (Signed)
Left detailed message for pt. Agree that symptoms and timing suggest a possible allergic reaction to Wellbutrin XL. Agree with the use of Benadryl and that she could also try taking a longer acting anti-histamine, such as Zyrtec, if needed. Advised to stop Wellbutrin XL. Advised the she go to an urgent care center or the ER if she experiences any breathing difficulty or swelling of her lip, tongue, or throat. Recommended that she call the office with any questions or to provide an update.

## 2018-11-11 ENCOUNTER — Encounter: Payer: Self-pay | Admitting: Psychiatry

## 2018-11-11 ENCOUNTER — Other Ambulatory Visit: Payer: Self-pay

## 2018-11-11 ENCOUNTER — Ambulatory Visit: Payer: BC Managed Care – PPO | Admitting: Psychiatry

## 2018-11-11 DIAGNOSIS — F329 Major depressive disorder, single episode, unspecified: Secondary | ICD-10-CM | POA: Diagnosis not present

## 2018-11-11 DIAGNOSIS — F32A Depression, unspecified: Secondary | ICD-10-CM

## 2018-11-11 DIAGNOSIS — F411 Generalized anxiety disorder: Secondary | ICD-10-CM

## 2018-11-11 MED ORDER — PAROXETINE HCL 20 MG PO TABS: 20.0000 mg | ORAL_TABLET | Freq: Every day | ORAL | 1 refills | Status: DC

## 2018-11-11 NOTE — Progress Notes (Signed)
Tonya Olsen [id number] 12/13/1980 38 y.o.  Subjective:   Patient ID:  Tonya Olsen is a 38 y.o. (DOB 01/13/1981) female.  Chief Complaint:  Chief Complaint  Patient presents with  . Follow-up    h/o depression and anixety    HPI MALAY FANTROY presents to the office today for follow-up of depression. She reports developing itchy, full body rash about 8-10 hours after first dose of Wellbutrin XL. Reports that she immediately took Benadryl and denies respiratory symptoms. She describes her mood as "stable." She reports that she does not have current depression. She reports that she has occ sad days and that she does not experience severe depressive s/s. Reports that she has not had any significant anxiety and has not used Xanax prn since last week. Reports mild anxiety related to uncertainty about upcoming school year. She reports that her energy and motivation have been low- "I just don't have much going on." She reports that she is sleeping well. She reports that her appetite has been stable. Denies concentration impairment. Denies SI.   Husband looking for a job since January and she is switching jobs. Children have been home.   Past Psychiatric Medication Trials: Cymbalta- Worked well for a year and then started having night sweats and felt "in a fog" and had recurrence of depression and stopped it in Summer 2018.  Paxil- Helpful with Depression. Has had sexual side effects. Has had night sweats.  Sertraline- "brain fog" and felt slowed.  Trintellix- Side effects Wellbutrin XL- Allergic reaction (rash) Lithium- Did not take since Depression improved.  Xanax  Review of Systems:  Review of Systems  Gastrointestinal: Positive for abdominal pain.  Musculoskeletal: Negative for gait problem.  Neurological: Negative for tremors and headaches.  Psychiatric/Behavioral:       Please refer to HPI    Medications: I have reviewed the patient's current medications.  Current  Outpatient Medications  Medication Sig Dispense Refill  . ALPRAZolam (XANAX) 0.5 MG tablet 1/2 to 1 prn anxiety. Limit 2 pills in a day 30 tablet 0  . PARoxetine (PAXIL) 20 MG tablet Take 1 tablet (20 mg total) by mouth daily. 90 tablet 1   No current facility-administered medications for this visit.     Medication Side Effects: Other: Sexual side effects, night sweats  Allergies:  Allergies  Allergen Reactions  . Wellbutrin Xl [Bupropion]     Past Medical History:  Diagnosis Date  . Complication of anesthesia    TAKES A WHILE TO WAKE UP  . Depression     History reviewed. No pertinent family history.  Social History   Socioeconomic History  . Marital status: Married    Spouse name: Not on file  . Number of children: Not on file  . Years of education: Not on file  . Highest education level: Not on file  Occupational History  . Not on file  Social Needs  . Financial resource strain: Not on file  . Food insecurity    Worry: Not on file    Inability: Not on file  . Transportation needs    Medical: Not on file    Non-medical: Not on file  Tobacco Use  . Smoking status: Never Smoker  . Smokeless tobacco: Never Used  Substance and Sexual Activity  . Alcohol use: No  . Drug use: No  . Sexual activity: Yes    Partners: Male    Birth control/protection: None    Comment: married  Lifestyle  . Physical  activity    Days per week: Not on file    Minutes per session: Not on file  . Stress: Not on file  Relationships  . Social Herbalist on phone: Not on file    Gets together: Not on file    Attends religious service: Not on file    Active member of club or organization: Not on file    Attends meetings of clubs or organizations: Not on file    Relationship status: Not on file  . Intimate partner violence    Fear of current or ex partner: Not on file    Emotionally abused: Not on file    Physically abused: Not on file    Forced sexual activity: Not on  file  Other Topics Concern  . Not on file  Social History Narrative  . Not on file    Past Medical History, Surgical history, Social history, and Family history were reviewed and updated as appropriate.   Please see review of systems for further details on the patient's review from today.   Objective:   Physical Exam:  There were no vitals taken for this visit.  Physical Exam Constitutional:      General: She is not in acute distress.    Appearance: She is well-developed.  Musculoskeletal:        General: No deformity.  Neurological:     Mental Status: She is alert and oriented to person, place, and time.     Coordination: Coordination normal.  Psychiatric:        Attention and Perception: Attention and perception normal. She does not perceive auditory or visual hallucinations.        Mood and Affect: Mood is not anxious or depressed. Affect is blunt. Affect is not labile, angry or inappropriate.        Speech: Speech normal.        Behavior: Behavior normal.        Thought Content: Thought content normal. Thought content does not include homicidal or suicidal ideation. Thought content does not include homicidal or suicidal plan.        Cognition and Memory: Cognition and memory normal.        Judgment: Judgment normal.     Comments: Insight intact. No delusions.      Lab Review:     Component Value Date/Time   NA 140 06/14/2015 1400   K 4.1 06/14/2015 1400   CL 106 06/14/2015 1400   CO2 25 06/14/2015 1400   GLUCOSE 106 (H) 06/14/2015 1400   BUN 9 06/14/2015 1400   CREATININE 0.61 06/14/2015 1400   CREATININE 0.74 06/25/2011 1144   CALCIUM 9.1 06/14/2015 1400   PROT 7.6 06/14/2015 1400   ALBUMIN 4.3 06/14/2015 1400   AST 21 06/14/2015 1400   ALT 28 06/14/2015 1400   ALKPHOS 101 06/14/2015 1400   BILITOT 0.5 06/14/2015 1400   GFRNONAA >60 06/14/2015 1400   GFRAA >60 06/14/2015 1400       Component Value Date/Time   WBC 11.9 (H) 04/13/2017 1230   RBC 3.96  04/13/2017 1230   HGB 11.0 (L) 04/13/2017 1230   HCT 33.3 (L) 04/13/2017 1230   PLT 297 04/13/2017 1230   MCV 84.1 04/13/2017 1230   MCV 89.1 06/25/2011 1144   MCH 27.8 04/13/2017 1230   MCHC 33.0 04/13/2017 1230   RDW 12.9 04/13/2017 1230   LYMPHSABS 1.0 03/04/2010 0349   MONOABS 1.0 03/04/2010 0349   EOSABS 0.2  03/04/2010 0349   BASOSABS 0.0 03/04/2010 0349    No results found for: POCLITH, LITHIUM   No results found for: PHENYTOIN, PHENOBARB, VALPROATE, CBMZ   .res Assessment: Plan:   Patient reports that she would like to continue Paxil 20 mg at this time since mood and anxiety signs and symptoms have been adequately controlled and benefits are currently outweighing any side effects.  Discussed that in the future she may wish to consider gradual dose reduction if side effects are problematic and mood and anxiety remain stable. Discussed option of seeing a different therapist since patient's previous therapist is no longer working in this office.  Patient reports that she does not feel that she needs therapy at this time.  Advised patient to contact office if she were to need to see a therapist in the future. Will continue Paxil 20 mg daily for mood and anxiety. Patient to follow-up in 3 to 4 months or sooner if clinically indicated.  Derriona was seen today for follow-up.  Diagnoses and all orders for this visit:  Depression, unspecified depression type -     PARoxetine (PAXIL) 20 MG tablet; Take 1 tablet (20 mg total) by mouth daily.     Please see After Visit Summary for patient specific instructions.  Future Appointments  Date Time Provider [place]  03/03/2019  2:00 PM Thayer Headings, PMHNP CP-CP None    No orders of the defined types were placed in this encounter.   -------------------------------

## 2019-03-03 ENCOUNTER — Ambulatory Visit: Payer: BC Managed Care – PPO | Admitting: Psychiatry

## 2019-04-28 ENCOUNTER — Telehealth: Payer: Self-pay | Admitting: Psychiatry

## 2019-04-28 ENCOUNTER — Other Ambulatory Visit: Payer: Self-pay

## 2019-04-28 DIAGNOSIS — F32A Depression, unspecified: Secondary | ICD-10-CM

## 2019-04-28 DIAGNOSIS — F329 Major depressive disorder, single episode, unspecified: Secondary | ICD-10-CM

## 2019-04-28 MED ORDER — PAROXETINE HCL 30 MG PO TABS: 30.0000 mg | ORAL_TABLET | Freq: Every day | ORAL | 0 refills | Status: DC

## 2019-04-28 NOTE — Telephone Encounter (Signed)
Patient advised to increase take Paxil 20 mg 1.5 tablets daily to equal to 30 mg daily. She has about 10-12 pills left, will send an updated Rx for 30 day supply to her [facility].

## 2019-04-28 NOTE — Telephone Encounter (Signed)
Pt called and said her medication, paxil , may need adjusting. I did schedule her as she is overdue.

## 2019-05-22 ENCOUNTER — Encounter: Payer: Self-pay | Admitting: Psychiatry

## 2019-05-22 ENCOUNTER — Ambulatory Visit (INDEPENDENT_AMBULATORY_CARE_PROVIDER_SITE_OTHER): Payer: BC Managed Care – PPO | Admitting: Psychiatry

## 2019-05-22 VITALS — BP 120/65 | HR 95 | Wt 229.0 lb

## 2019-05-22 DIAGNOSIS — F411 Generalized anxiety disorder: Secondary | ICD-10-CM | POA: Diagnosis not present

## 2019-05-22 DIAGNOSIS — F329 Major depressive disorder, single episode, unspecified: Secondary | ICD-10-CM

## 2019-05-22 DIAGNOSIS — F32A Depression, unspecified: Secondary | ICD-10-CM

## 2019-05-22 MED ORDER — PAROXETINE HCL 30 MG PO TABS: 30.0000 mg | ORAL_TABLET | Freq: Every day | ORAL | 1 refills | Status: DC

## 2019-05-22 NOTE — Progress Notes (Signed)
[name] 1980/09/27 39 y.o.  Virtual Visit via Telephone Note  I connected with pt on 05/22/19 at 12:00 PM EST by telephone and verified that I am speaking with the correct person using two identifiers.   I discussed the limitations, risks, security and privacy concerns of performing an evaluation and management service by telephone and the availability of in person appointments. I also discussed with the patient that there may be a patient responsible charge related to this service. The patient expressed understanding and agreed to proceed.   I discussed the assessment and treatment plan with the patient. The patient was provided an opportunity to ask questions and all were answered. The patient agreed with the plan and demonstrated an understanding of the instructions.   The patient was advised to call back or seek an in-person evaluation if the symptoms worsen or if the condition fails to improve as anticipated.  I provided 20 minutes of non-face-to-face time during this encounter.  The patient was located at home.  The provider was located at [place].   Thayer Headings, PMHNP   Subjective:   Patient ID:  Tonya Olsen is a 39 y.o. (DOB [birth year]) female.  Chief Complaint:  Chief Complaint  Patient presents with  . Follow-up    Recent depression, history of anxiety    HPI Tonya Olsen presents for follow-up of depression and anxiety.  Reports that she was experiencing some depression when she called 04/28/19 and was directed to increase Paxil. She was noticing that she was more reclusive and wanting to sleep more. She reports that these s/s persisted and she then called office.   She reports that these s/s improved after a few days and now have resolved. Has had some increase in night sweats. Reports that she now has "moments where I am melancholy" and are not persistent or consistent to depression. 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 Had increased heart rate, twitching, crying and some sleep disturbance. Reports that her anxiety has resolved when she learned that school was going to remain remote.   She reports that she does not have difficulty falling or staying asleep. Does not get as much sleep as she would like due to other factors. No longer sleeping excessively. She describes her appetite as healthy and has not been emotional eating since increase in Paxil. Reports improved energy and motivation. She reports that her motivation has been consistent with baseline. Adequate concentration. Denies any social isolation now. Denies diminished interest or enjoyment. Denies SI.   Has not needed Xanax prn anxiety. 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   Past Psychiatric Medication Trials: Cymbalta- Worked well for a year and then started having night sweats and felt "in a fog" and had recurrence of depression and stopped it in Summer 2018.  Paxil- Helpful with Depression. Has had sexual side effects. Has had night sweats.  Sertraline- "brain fog" and felt slowed.  Trintellix- Side effects Wellbutrin XL- Allergic reaction (rash) Lithium- Did not take since Depression improved.  Xanax  Review of Systems:  Review of Systems  Constitutional:       Night sweats  Musculoskeletal: Negative for gait problem.  Neurological: Negative for tremors.  Psychiatric/Behavioral:       Please refer to HPI   Reports that her leg frequently "jumps" in the last year and can control it.   Medications: I have reviewed the patient's current medications.  Current Outpatient Medications  Medication Sig Dispense Refill  . PARoxetine (PAXIL) 30 MG tablet Take 1 tablet (30 mg total) by mouth  daily. 90 tablet 1  . ALPRAZolam (XANAX) 0.5 MG tablet 1/2 to 1 prn anxiety. Limit 2 pills in a day 30 tablet 0   No current facility-administered medications for this visit.    Medication Side Effects: Other: Sexual side effects, night sweats  Allergies:  Allergies  Allergen Reactions  . Wellbutrin Xl [Bupropion]     Past Medical History:  Diagnosis Date  . Complication of anesthesia    TAKES A WHILE TO WAKE UP  . Depression     History reviewed. No pertinent family history.  Social History   Socioeconomic History  . Marital status: Married    Spouse name: Not on file  . Number of children: Not on file  . Years of education: Not on file  . Highest education level: Not on file  Occupational History  . Not on file  Tobacco Use  . Smoking status: Never Smoker  . Smokeless tobacco: Never Used  Substance and Sexual Activity  . Alcohol use: No  . Drug use: No  . Sexual activity: Yes    Partners: Male    Birth control/protection: None    Comment: married  Other Topics Concern  . Not on file  Social History Narrative  . Not on file   Social Determinants of Health   Financial Resource Strain:   . Difficulty of Paying Living Expenses: Not on file  Food Insecurity:   . Worried About Charity fundraiser in the Last Year: Not on file  . Ran Out of Food in the Last Year: Not on file  Transportation Needs:   . Lack of Transportation (Medical): Not on file  . Lack of Transportation (Non-Medical): Not on file  Physical Activity:   . Days of Exercise per Week: Not on file  . Minutes of Exercise per Session: Not on file  Stress:   . Feeling of Stress : Not on file  Social Connections:   . Frequency of Communication with Friends and Family: Not on file  . Frequency of Social Gatherings with Friends and Family: Not on file  . Attends Religious Services: Not on file  . Active Member of Clubs or Organizations: Not on file  . Attends Archivist Meetings: Not on  file  . Marital Status: Not on file  Intimate Partner Violence:   . Fear of Current or Ex-Partner: Not on file  . Emotionally Abused: Not on file  . Physically Abused: Not on file  . Sexually Abused: Not on file    Past Medical History, Surgical history, Social history, and Family history were reviewed and updated as appropriate.   Please see review of systems for further details on the patient's review from today.   Objective:   Physical Exam:  BP 120/65   Pulse 95   Wt 229 lb (103.9 kg)   BMI 41.88 kg/m   Physical Exam Neurological:     Mental Status: She is alert and oriented to person, place, and time.     Cranial Nerves: No dysarthria.  Psychiatric:        Attention and Perception: Attention and perception normal.  Mood and Affect: Mood normal.        Speech: Speech normal.        Behavior: Behavior is cooperative.        Thought Content: Thought content normal. Thought content is not paranoid or delusional. Thought content does not include homicidal or suicidal ideation. Thought content does not include homicidal or suicidal plan.        Cognition and Memory: Cognition and memory normal.        Judgment: Judgment normal.     Comments: Insight intact     Lab Review:     Component Value Date/Time   NA 140 06/14/2015 1400   K 4.1 06/14/2015 1400   CL 106 06/14/2015 1400   CO2 25 06/14/2015 1400   GLUCOSE 106 (H) 06/14/2015 1400   BUN 9 06/14/2015 1400   CREATININE 0.61 06/14/2015 1400   CREATININE 0.74 06/25/2011 1144   CALCIUM 9.1 06/14/2015 1400   PROT 7.6 06/14/2015 1400   ALBUMIN 4.3 06/14/2015 1400   AST 21 06/14/2015 1400   ALT 28 06/14/2015 1400   ALKPHOS 101 06/14/2015 1400   BILITOT 0.5 06/14/2015 1400   GFRNONAA >60 06/14/2015 1400   GFRAA >60 06/14/2015 1400       Component Value Date/Time   WBC 11.9 (H) 04/13/2017 1230   RBC 3.96 04/13/2017 1230   HGB 11.0 (L) 04/13/2017 1230   HCT 33.3 (L) 04/13/2017 1230   PLT 297 04/13/2017  1230   MCV 84.1 04/13/2017 1230   MCV 89.1 06/25/2011 1144   MCH 27.8 04/13/2017 1230   MCHC 33.0 04/13/2017 1230   RDW 12.9 04/13/2017 1230   LYMPHSABS 1.0 03/04/2010 0349   MONOABS 1.0 03/04/2010 0349   EOSABS 0.2 03/04/2010 0349   BASOSABS 0.0 03/04/2010 0349    No results found for: POCLITH, LITHIUM   No results found for: PHENYTOIN, PHENOBARB, VALPROATE, CBMZ   .res Assessment: Plan:   Patient reports improved depression and anxiety with recent increase in Paxil and reports that signs and symptoms are now well controlled.  She reports that benefits are outweighing side effects at this time and would like to continue Paxil at 30 mg daily. Patient reports that she has not needed to take Xanax recently and does not need a refill on Xanax at this time.  Advised patient to contact office if she needs a refill and Xanax prior to next appointment. Patient to follow-up in 6 months or sooner if clinically indicated. Patient advised to contact office with any questions, adverse effects, or acute worsening in signs and symptoms.   [place] was seen today for follow-up.  Diagnoses and all orders for this visit:  Depression, unspecified depression type -     PARoxetine (PAXIL) 30 MG tablet; Take 1 tablet (30 mg total) by mouth daily.  Generalized anxiety disorder    Please see After Visit Summary for patient specific instructions.  No future appointments.  No orders of the defined types were placed in this encounter.     -------------------------------

## 2019-06-11 ENCOUNTER — Telehealth: Payer: Self-pay | Admitting: Psychiatry

## 2019-06-11 ENCOUNTER — Other Ambulatory Visit: Payer: Self-pay

## 2019-06-11 DIAGNOSIS — F32A Depression, unspecified: Secondary | ICD-10-CM

## 2019-06-11 DIAGNOSIS — F329 Major depressive disorder, single episode, unspecified: Secondary | ICD-10-CM

## 2019-06-11 MED ORDER — PAROXETINE HCL 20 MG PO TABS: 20.0000 mg | ORAL_TABLET | Freq: Every day | ORAL | 0 refills | Status: DC

## 2019-06-11 NOTE — Telephone Encounter (Signed)
She uses [facility] on [address]

## 2019-06-11 NOTE — Telephone Encounter (Signed)
Tried to reach patient, but went to voicemail and her mail box is full. Will try to reach later.

## 2019-06-11 NOTE — Telephone Encounter (Signed)
Noted thank you, Rx submitted for [facility]oxetine 20 mg to [facility]

## 2019-06-11 NOTE — Telephone Encounter (Signed)
Needs to discuss lowering dose of Paxil. Having night sweats, bad daytime drowsiness, but nightime insomnia, decreased concentration, dry mouth.

## 2019-06-11 NOTE — Telephone Encounter (Signed)
Pt. Made aware. She does need a new RX sent in.

## 2019-07-05 ENCOUNTER — Ambulatory Visit: Payer: BC Managed Care – PPO | Attending: Internal Medicine

## 2019-07-05 DIAGNOSIS — Z23 Encounter for immunization: Secondary | ICD-10-CM | POA: Insufficient documentation

## 2019-07-05 NOTE — Progress Notes (Signed)
   Covid-19 Vaccination Clinic  Name:  Tonya Olsen    MRN: [id number] DOB: [birth year]  07/05/2019  [name] was observed post Covid-19 immunization for 15 minutes without incidence. She was provided with Vaccine Information Sheet and instruction to access the V-Safe system.   [name] was instructed to call 911 with any severe reactions post vaccine: Marland Kitchen Difficulty breathing  . Swelling of your face and throat  . A fast heartbeat  . A bad rash all over your body  . Dizziness and weakness    Immunizations Administered    Name Date Dose VIS Date Route   Moderna COVID-19 Vaccine 07/05/2019 10:13 AM 0.5 mL 04/08/2019 Intramuscular   Manufacturer: Moderna   Lot: [id number]   [id number]

## 2019-08-02 ENCOUNTER — Ambulatory Visit: Payer: BC Managed Care – PPO | Attending: Internal Medicine

## 2019-08-02 DIAGNOSIS — Z23 Encounter for immunization: Secondary | ICD-10-CM

## 2019-08-02 NOTE — Progress Notes (Signed)
   Covid-19 Vaccination Clinic  Name:  Tonya Olsen    MRN: [id number] DOB: 1981-04-19  08/02/2019  Tonya Olsen was observed post Covid-19 immunization for 15 minutes without incident. She was provided with Vaccine Information Sheet and instruction to access the V-Safe system.   Tonya Olsen was instructed to call 911 with any severe reactions post vaccine: Marland Kitchen Difficulty breathing  . Swelling of face and throat  . A fast heartbeat  . A bad rash all over body  . Dizziness and weakness   Immunizations Administered    Name Date Dose VIS Date Route   Moderna COVID-19 Vaccine 08/02/2019  9:38 AM 0.5 mL 04/08/2019 Intramuscular   Manufacturer: Moderna   Lot: [id number]   [id number]

## 2019-08-06 ENCOUNTER — Ambulatory Visit: Payer: BC Managed Care – PPO

## 2019-09-15 ENCOUNTER — Other Ambulatory Visit: Payer: Self-pay | Admitting: Psychiatry

## 2019-09-15 DIAGNOSIS — F329 Major depressive disorder, single episode, unspecified: Secondary | ICD-10-CM

## 2019-09-15 DIAGNOSIS — F32A Depression, unspecified: Secondary | ICD-10-CM

## 2019-11-05 ENCOUNTER — Ambulatory Visit: Payer: BC Managed Care – PPO | Admitting: Mental Health

## 2019-11-12 ENCOUNTER — Encounter: Payer: Self-pay | Admitting: Neurology

## 2019-12-12 ENCOUNTER — Other Ambulatory Visit: Payer: Self-pay | Admitting: Psychiatry

## 2019-12-12 DIAGNOSIS — F32A Depression, unspecified: Secondary | ICD-10-CM

## 2020-02-03 ENCOUNTER — Ambulatory Visit: Payer: BC Managed Care – PPO | Admitting: Neurology

## 2020-03-14 ENCOUNTER — Other Ambulatory Visit: Payer: Self-pay | Admitting: Psychiatry

## 2020-03-14 DIAGNOSIS — F32A Depression, unspecified: Secondary | ICD-10-CM

## 2020-03-15 NOTE — Telephone Encounter (Signed)
Last apt 05/2019

## 2020-04-11 ENCOUNTER — Other Ambulatory Visit: Payer: Self-pay | Admitting: Psychiatry

## 2020-04-11 DIAGNOSIS — F32A Depression, unspecified: Secondary | ICD-10-CM

## 2020-05-10 ENCOUNTER — Ambulatory Visit (INDEPENDENT_AMBULATORY_CARE_PROVIDER_SITE_OTHER): Payer: BC Managed Care – PPO | Admitting: Psychiatry

## 2020-05-10 ENCOUNTER — Encounter: Payer: Self-pay | Admitting: Psychiatry

## 2020-05-10 ENCOUNTER — Encounter (INDEPENDENT_AMBULATORY_CARE_PROVIDER_SITE_OTHER): Payer: Self-pay | Admitting: Bariatrics

## 2020-05-10 ENCOUNTER — Other Ambulatory Visit (INDEPENDENT_AMBULATORY_CARE_PROVIDER_SITE_OTHER): Payer: Self-pay | Admitting: Bariatrics

## 2020-05-10 ENCOUNTER — Other Ambulatory Visit: Payer: Self-pay

## 2020-05-10 ENCOUNTER — Ambulatory Visit (INDEPENDENT_AMBULATORY_CARE_PROVIDER_SITE_OTHER): Payer: BC Managed Care – PPO | Admitting: Bariatrics

## 2020-05-10 VITALS — BP 119/78 | HR 86 | Temp 97.9°F | Ht 63.0 in | Wt 238.0 lb

## 2020-05-10 DIAGNOSIS — F32A Depression, unspecified: Secondary | ICD-10-CM

## 2020-05-10 DIAGNOSIS — Z6841 Body Mass Index (BMI) 40.0 and over, adult: Secondary | ICD-10-CM

## 2020-05-10 DIAGNOSIS — R0602 Shortness of breath: Secondary | ICD-10-CM

## 2020-05-10 DIAGNOSIS — N912 Amenorrhea, unspecified: Secondary | ICD-10-CM | POA: Insufficient documentation

## 2020-05-10 DIAGNOSIS — F3289 Other specified depressive episodes: Secondary | ICD-10-CM

## 2020-05-10 DIAGNOSIS — K760 Fatty (change of) liver, not elsewhere classified: Secondary | ICD-10-CM

## 2020-05-10 DIAGNOSIS — Z0289 Encounter for other administrative examinations: Secondary | ICD-10-CM

## 2020-05-10 DIAGNOSIS — D509 Iron deficiency anemia, unspecified: Secondary | ICD-10-CM | POA: Insufficient documentation

## 2020-05-10 DIAGNOSIS — E559 Vitamin D deficiency, unspecified: Secondary | ICD-10-CM

## 2020-05-10 DIAGNOSIS — F338 Other recurrent depressive disorders: Secondary | ICD-10-CM | POA: Insufficient documentation

## 2020-05-10 DIAGNOSIS — E66813 Obesity, class 3: Secondary | ICD-10-CM

## 2020-05-10 DIAGNOSIS — R5383 Other fatigue: Secondary | ICD-10-CM | POA: Diagnosis not present

## 2020-05-10 DIAGNOSIS — F331 Major depressive disorder, recurrent, moderate: Secondary | ICD-10-CM | POA: Insufficient documentation

## 2020-05-10 DIAGNOSIS — R5382 Chronic fatigue, unspecified: Secondary | ICD-10-CM | POA: Insufficient documentation

## 2020-05-10 DIAGNOSIS — G9332 Myalgic encephalomyelitis/chronic fatigue syndrome: Secondary | ICD-10-CM | POA: Insufficient documentation

## 2020-05-10 DIAGNOSIS — G44209 Tension-type headache, unspecified, not intractable: Secondary | ICD-10-CM | POA: Insufficient documentation

## 2020-05-10 DIAGNOSIS — R7303 Prediabetes: Secondary | ICD-10-CM | POA: Diagnosis not present

## 2020-05-10 DIAGNOSIS — K5909 Other constipation: Secondary | ICD-10-CM

## 2020-05-10 MED ORDER — PAROXETINE HCL 20 MG PO TABS: 20.0000 mg | ORAL_TABLET | Freq: Every day | ORAL | 2 refills | Status: DC

## 2020-05-10 NOTE — Progress Notes (Signed)
BRYNNLEE WITHEE [id number] 1980-09-29 40 y.o.  Subjective:   Patient ID:  Tonya Olsen is a 40 y.o. (DOB 1980-10-10) female.  Chief Complaint:  Chief Complaint  Patient presents with  . Follow-up    Anxiety, depression    HPI Tonya Olsen presents to the office today for follow-up of anxiety and depression. She has been diagnosed with psoriasis and reports that this is "taxing." She reports that she feels tired all the time. She reports that stress triggers psoriasis.  She reports depression has been "manageable... I am stable." She reports that anxiety has been manageable and has not needed to take prn medications. Denies panic s/s. Occasionally experiences anxiety. She reports that her motivation is "not as high as it could be." She reports that she will participate in things if someone asks but typicaly does not initiate it. She reports that her sleep is "a lot and a little at the same time." She reports that sometimes she has difficulty falling asleep at night and other times can fall asleep and stay asleep. Sometimes sleeping 3-4 hours at night. Will take 2 naps when schedule allows and may sleep 4 hours during the day. Difficulty staying awake while driving. Typically does not snore and husband comments he can hear breathing in her sleep. Appetite has been normal. She reports that she will get "tunnel vision" and will hyper-focus at times on one task and has difficulty multi-tasking. Denies SI.   She reports, "I love work. 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   Son is in 39th grade and turns 29 yo in February. Daughter is 39 yo and in 1st grade. Lost grandmother recently.  She and her husband have been seeing a marriage counselor through [place] for the last 8-10 months.  Has not used Xanax prn in over a year.    Past Psychiatric Medication Trials: Cymbalta- Worked well for a year and then started having  night sweats and felt "in a fog" and had recurrence of depression and stopped it in Summer 2018.  Paxil- Helpful with Depression. Has had sexual side effects. Has had night sweats.  Sertraline- "brain fog" and felt slowed.  Trintellix- Side effects Wellbutrin XL- Allergic reaction (rash) Lithium- Did not take since Depression improved.  Xanax PHQ2-9   [place] Office Visit from 05/10/2020 in [place]  PHQ-2 Total Score 6  PHQ-9 Total Score 23       Review of Systems:  Review of Systems  Constitutional: Positive for fatigue.  Musculoskeletal: Negative for gait problem.  Skin: Positive for rash.       Psoriasis with itching and burning  Neurological: Negative for tremors.  Psychiatric/Behavioral:       Please refer to HPI  Plans to see [facility] today.  She reports that her glucose levels and liver enzymes were slightly elevated.    Medications: I have reviewed the patient's current medications.  Current Outpatient Medications  Medication Sig Dispense Refill  . SKYRIZI 150 MG/ML SOSY     . PARoxetine (PAXIL) 20 MG tablet Take 1 tablet (20 mg total) by mouth daily. 90 tablet 2   No current facility-administered medications for this visit.    Medication Side Effects: None  Allergies:  Allergies  Allergen Reactions  . Wellbutrin Xl [Bupropion]     Past Medical History:  Diagnosis Date  . Anxiety   . Back  pain   . Complication of anesthesia    TAKES A WHILE TO WAKE UP  . Constipation   . Depression   . Fatty liver   . Pre-diabetes   . Psoriasis     Family History  Problem Relation Age of Onset  . Diabetes Mother   . High blood pressure Mother   . High Cholesterol Mother   . Obesity Mother   . Diabetes Father   . High blood pressure Father   . Cancer Father   . Depression Father   . Obesity Father     Social History   Socioeconomic History  . Marital status: Married    Spouse name: [name]  .  Number of children: Not on file  . Years of education: Not on file  . Highest education level: Not on file  Occupational History  . Occupation: Metallurgist - [employer]  Tobacco Use  . Smoking status: Never Smoker  . Smokeless tobacco: Never Used  Substance and Sexual Activity  . Alcohol use: No  . Drug use: No  . Sexual activity: Yes    Partners: Male    Birth control/protection: None    Comment: married  Other Topics Concern  . Not on file  Social History Narrative  . Not on file   Social Determinants of Health   Financial Resource Strain: Not on file  Food Insecurity: Not on file  Transportation Needs: Not on file  Physical Activity: Not on file  Stress: Not on file  Social Connections: Not on file  Intimate Partner Violence: Not on file    Past Medical History, Surgical history, Social history, and Family history were reviewed and updated as appropriate.   Please see review of systems for further details on the patient's review from today.   Objective:   Physical Exam:  LMP 05/01/2020   Physical Exam Constitutional:      General: She is not in acute distress. Musculoskeletal:        General: No deformity.  Neurological:     Mental Status: She is alert and oriented to person, place, and time.     Coordination: Coordination normal.  Psychiatric:        Attention and Perception: Attention and perception normal. She does not perceive auditory or visual hallucinations.        Mood and Affect: Mood normal. Mood is not anxious or depressed. Affect is not labile, blunt, angry or inappropriate.        Speech: Speech normal.        Behavior: Behavior normal.        Thought Content: Thought content normal. Thought content is not paranoid or delusional. Thought content does not include homicidal or suicidal ideation. Thought content does not include homicidal or suicidal plan.        Cognition and Memory: Cognition and memory normal.        Judgment: Judgment normal.      Comments: Insight intact     Lab Review:     Component Value Date/Time   NA 140 06/14/2015 1400   K 4.1 06/14/2015 1400   CL 106 06/14/2015 1400   CO2 25 06/14/2015 1400   GLUCOSE 106 (H) 06/14/2015 1400   BUN 9 06/14/2015 1400   CREATININE 0.61 06/14/2015 1400   CREATININE 0.74 06/25/2011 1144   CALCIUM 9.1 06/14/2015 1400   PROT 7.6 06/14/2015 1400   ALBUMIN 4.3 06/14/2015 1400   AST 21 06/14/2015 1400   ALT 28  06/14/2015 1400   ALKPHOS 101 06/14/2015 1400   BILITOT 0.5 06/14/2015 1400   GFRNONAA >60 06/14/2015 1400   GFRAA >60 06/14/2015 1400       Component Value Date/Time   WBC 11.9 (H) 04/13/2017 1230   RBC 3.96 04/13/2017 1230   HGB 11.0 (L) 04/13/2017 1230   HCT 33.3 (L) 04/13/2017 1230   PLT 297 04/13/2017 1230   MCV 84.1 04/13/2017 1230   MCV 89.1 06/25/2011 1144   MCH 27.8 04/13/2017 1230   MCHC 33.0 04/13/2017 1230   RDW 12.9 04/13/2017 1230   LYMPHSABS 1.0 03/04/2010 0349   MONOABS 1.0 03/04/2010 0349   EOSABS 0.2 03/04/2010 0349   BASOSABS 0.0 03/04/2010 0349    No results found for: POCLITH, LITHIUM   No results found for: PHENYTOIN, PHENOBARB, VALPROATE, CBMZ   .res Assessment: Plan:   Pt seen for 30 minutes and time spent discussing treatment plan. Discussed that Paxil can contribute to weight gain. She reports that at this time benefits are outweighing potential side effects since she has had adverse effects and limited benefit with other medication trials in the past. Agree with plan to establish care with [facility] and to rule out medical causes for wt gain and fatigue.  Continue Paxil 20 mg po qd for mood and anxiety.  Will discontinue Xanax prn due to non-use. Pt advised to contact office if she needs Xanax re-started in the future.  Pt to follow-up in 6 months or sooner if clinically indicated.  Patient advised to contact office with any questions, adverse effects, or acute worsening in signs and  symptoms.   Jamesha was seen today for follow-up.  Diagnoses and all orders for this visit:  Depression, unspecified depression type -     PARoxetine (PAXIL) 20 MG tablet; Take 1 tablet (20 mg total) by mouth daily.     Please see After Visit Summary for patient specific instructions.  Future Appointments  Date Time Provider Department Center  05/24/2020  7:20 AM Roswell Nickel, DO [facility] None  11/09/2020  8:30 AM Corie Chiquito, PMHNP [facility] None    No orders of the defined types were placed in this encounter.   -------------------------------

## 2020-05-10 NOTE — Progress Notes (Signed)
Dear Dr. Kateri Plummer,   Thank you for referring Tonya Olsen to our clinic. The following note includes my evaluation and treatment recommendations.  Chief Complaint:   OBESITY Tonya Olsen (MR# [id number]) is a 40 y.o. female who presents for evaluation and treatment of obesity and related comorbidities. Current BMI is Body mass index is 42.16 kg/m. Tonya Olsen has been struggling with her weight for many years and has been unsuccessful in either losing weight, maintaining weight loss, or reaching her healthy weight goal.  Tonya Olsen is currently in the action stage of change and ready to dedicate time achieving and maintaining a healthier weight. Tonya Olsen is interested in becoming our patient and working on intensive lifestyle modifications including (but not limited to) diet and exercise for weight loss.  Tonya Olsen eats 5-10 meals outside the home.  She does not like to cook and notes obstacles as planning and being fatigued.  She considers herself a "picky eater".  She skips breakfast and sometimes lunch.  Tonya Olsen habits were reviewed today and are as follows: Her family eats meals together, she struggles with family and or coworkers weight loss sabotage, her desired weight loss is 137 pounds, she has been heavy most of her life, she started gaining weight in college, her heaviest weight ever was 247 pounds, she is a picky eater and doesn't like to eat healthier foods, she craves salty, crunchy, cheesy foods and sometimes chocolate or ice cream, she skips breakfast of lunch about half the time, she is frequently drinking liquids with calories, she frequently makes poor food choices, she frequently eats larger portions than normal and she struggles with emotional eating.  Depression Screen Tonya Olsen's Food and Mood (modified PHQ-9) score was 23.  Depression screen PHQ 2/9 05/10/2020  Decreased Interest 3  Down, Depressed, Hopeless 3  PHQ - 2 Score 6  Altered sleeping 3  Tired, decreased energy 3  Change in  appetite 3  Feeling bad or failure about yourself  3  Trouble concentrating 3  Moving slowly or fidgety/restless 1  Suicidal thoughts 1  PHQ-9 Score 23  Difficult doing work/chores Very difficult   Subjective:   1. Other fatigue Tonya Olsen admits to daytime somnolence and reports waking up still tired. Patent has a history of symptoms of daytime fatigue, morning fatigue and morning headache. Tonya Olsen generally gets 6 hours of sleep per night, and states that she has poor quality sleep. Snoring is not present. Apneic episodes are not present. Epworth Sleepiness Score is 19.  2. SOB (shortness of breath) Tonya Olsen notes increasing shortness of breath with exercising and seems to be worsening over time with weight gain. She notes getting out of breath sooner with activity than she used to. This has gotten worse recently. Tonya Olsen denies shortness of breath at rest or orthopnea.  3. Prediabetes Tonya Olsen has a diagnosis of prediabetes based on her elevated HgA1c and was informed this puts her at greater risk of developing diabetes. She continues to work on diet and exercise to decrease her risk of diabetes. She denies nausea or hypoglycemia.  Appropriate hunger.  Glucose 102, A1c 6.2.  4. Fatty liver She has not had a liver biopsy.  5. Other constipation She is not taking any medication for constipation.  6. Vitamin D deficiency She is currently taking no vitamin D supplement.   7. Other depression, with emotional eating Tonya Olsen sees a Theme park manager.  PHQ-9 is 23.  Assessment/Plan:   1. Other fatigue Tonya Olsen does feel that her weight  is causing her energy to be lower than it should be. Fatigue may be related to obesity, depression or many other causes. Labs will be ordered, and in the meanwhile, Lilo will focus on self care including making healthy food choices, increasing physical activity and focusing on stress reduction.  - EKG 12-Lead - Comprehensive metabolic panel - Insulin, random - VITAMIN  D 25 Hydroxy (Vit-D Deficiency, Fractures)  2. SOB (shortness of breath) Tonya Olsen does feel that she gets out of breath more easily that she used to when she exercises. Jermani's shortness of breath appears to be obesity related and exercise induced. She has agreed to work on weight loss and gradually increase exercise to treat her exercise induced shortness of breath. Will continue to monitor closely.  3. Prediabetes Tonya Olsen will continue to work on weight loss, exercise, and decreasing simple carbohydrates to help decrease the risk of diabetes.  Will check insulin level and A1c today.  - Insulin, random  4. Fatty liver Will check CMP today.  PCP will follow.  5. Other constipation Tonya Olsen will increase her water intake to 64 ounces per day.  She will also increase her fiber intake.  6. Vitamin D deficiency Will check vitamin D level today.  - VITAMIN D 25 Hydroxy (Vit-D Deficiency, Fractures)  7. Other depression, with emotional eating Continue with psychiatrist.  8. Class 3 severe obesity with serious comorbidity and body mass index (BMI) of 40.0 to 44.9 in adult, unspecified obesity type (HCC)  Tonya Olsen is currently in the action stage of change and her goal is to continue with weight loss efforts. I recommend Tonya Olsen begin the structured treatment plan as follows:  She has agreed to the Category 2 Plan +100 calories.  She will work on meal planning, intentional eating, and stopping all sugary drinks.  Reviewed labs from 02/09/2020, including A1c, CMP, health panel, and TSH independently and with the patient.    Exercise goals: No exercise has been prescribed at this time.   Behavioral modification strategies: increasing lean protein intake, decreasing simple carbohydrates, increasing vegetables, increasing water intake, decreasing eating out, no skipping meals, meal planning and cooking strategies, keeping healthy foods in the home, dealing with family or coworker sabotage, travel eating  strategies, holiday eating strategies  and celebration eating strategies.  She was informed of the importance of frequent follow-up visits to maximize her success with intensive lifestyle modifications for her multiple health conditions. She was informed we would discuss her lab results at her next visit unless there is a critical issue that needs to be addressed sooner. Tonya Olsen agreed to keep her next visit at the agreed upon time to discuss these results.  Objective:   Blood pressure 119/78, pulse 86, temperature 97.9 F (36.6 C), temperature source Oral, height 5\' 3"  (1.6 m), weight 238 lb (108 kg), last menstrual period 05/01/2020, SpO2 95 %. Body mass index is 42.16 kg/m.  EKG: Normal sinus rhythm, rate 85 bpm.  Indirect Calorimeter completed today shows a VO2 of 270 and a REE of 1880.  Her calculated basal metabolic rate is [id number] thus her basal metabolic rate is better than expected.  General: Cooperative, alert, well developed, in no acute distress. HEENT: Conjunctivae and lids unremarkable. Cardiovascular: Regular rhythm.  Lungs: Normal work of breathing. Neurologic: No focal deficits.   Lab Results  Component Value Date   CREATININE 0.61 06/14/2015   BUN 9 06/14/2015   NA 140 06/14/2015   K 4.1 06/14/2015   CL 106 06/14/2015   CO2  25 06/14/2015   Lab Results  Component Value Date   ALT 28 06/14/2015   AST 21 06/14/2015   ALKPHOS 101 06/14/2015   BILITOT 0.5 06/14/2015   Lab Results  Component Value Date   WBC 11.9 (H) 04/13/2017   HGB 11.0 (L) 04/13/2017   HCT 33.3 (L) 04/13/2017   MCV 84.1 04/13/2017   PLT 297 04/13/2017   Attestation Statements:   Reviewed by clinician on day of visit: allergies, medications, problem list, medical history, surgical history, family history, social history, and previous encounter notes.  I, Water quality scientist, CMA, am acting as Location manager for [employer], DO  I have reviewed the above documentation for accuracy and completeness,  and I agree with the above. Jearld Lesch, DO

## 2020-05-11 ENCOUNTER — Encounter (INDEPENDENT_AMBULATORY_CARE_PROVIDER_SITE_OTHER): Payer: Self-pay | Admitting: Bariatrics

## 2020-05-11 DIAGNOSIS — E559 Vitamin D deficiency, unspecified: Secondary | ICD-10-CM | POA: Insufficient documentation

## 2020-05-11 LAB — COMPREHENSIVE METABOLIC PANEL
ALT: 106 IU/L — ABNORMAL HIGH (ref 0–32)
AST: 65 IU/L — ABNORMAL HIGH (ref 0–40)
Albumin/Globulin Ratio: 1.5 (ref 1.2–2.2)
Albumin: 4.7 g/dL (ref 3.8–4.8)
Alkaline Phosphatase: 93 IU/L (ref 44–121)
BUN/Creatinine Ratio: 13 (ref 9–23)
BUN: 10 mg/dL (ref 6–20)
Bilirubin Total: 0.7 mg/dL (ref 0.0–1.2)
CO2: 25 mmol/L (ref 20–29)
Calcium: 9.6 mg/dL (ref 8.7–10.2)
Chloride: 99 mmol/L (ref 96–106)
Creatinine, Ser: 0.78 mg/dL (ref 0.57–1.00)
GFR calc Af Amer: 112 mL/min/{1.73_m2} (ref >59)
GFR calc non Af Amer: 97 mL/min/{1.73_m2} (ref >59)
Globulin, Total: 3.1 g/dL (ref 1.5–4.5)
Glucose: 105 mg/dL — ABNORMAL HIGH (ref 65–99)
Potassium: 4.6 mmol/L (ref 3.5–5.2)
Sodium: 138 mmol/L (ref 134–144)
Total Protein: 7.8 g/dL (ref 6.0–8.5)

## 2020-05-11 LAB — INSULIN, RANDOM: INSULIN: 21 u[IU]/mL (ref 2.6–24.9)

## 2020-05-11 LAB — VITAMIN D 25 HYDROXY (VIT D DEFICIENCY, FRACTURES): Vit D, 25-Hydroxy: 11.1 ng/mL — ABNORMAL LOW (ref 30.0–100.0)

## 2020-05-24 ENCOUNTER — Encounter (INDEPENDENT_AMBULATORY_CARE_PROVIDER_SITE_OTHER): Payer: Self-pay | Admitting: Bariatrics

## 2020-05-24 ENCOUNTER — Telehealth (INDEPENDENT_AMBULATORY_CARE_PROVIDER_SITE_OTHER): Payer: BC Managed Care – PPO | Admitting: Bariatrics

## 2020-05-24 DIAGNOSIS — R7303 Prediabetes: Secondary | ICD-10-CM

## 2020-05-24 DIAGNOSIS — E559 Vitamin D deficiency, unspecified: Secondary | ICD-10-CM | POA: Diagnosis not present

## 2020-05-24 DIAGNOSIS — R748 Abnormal levels of other serum enzymes: Secondary | ICD-10-CM | POA: Diagnosis not present

## 2020-05-24 DIAGNOSIS — Z6841 Body Mass Index (BMI) 40.0 and over, adult: Secondary | ICD-10-CM

## 2020-05-24 MED ORDER — VITAMIN D (ERGOCALCIFEROL) 1.25 MG (50000 UNIT) PO CAPS: 50000.0000 [IU] | ORAL_CAPSULE | ORAL | 0 refills | Status: DC

## 2020-05-26 NOTE — Progress Notes (Unsigned)
TeleHealth Visit:  Due to the COVID-19 pandemic, this visit was completed with telemedicine (audio/video) technology to reduce patient and provider exposure as well as to preserve personal protective equipment.   Tonya Olsen has verbally consented to this TeleHealth visit. The patient is located at home, the provider is located at the [facility] office. The participants in this visit include the listed provider and patient. The visit was conducted today via telephone.  [name] was unable to use realtime audiovisual technology today and the telehealth visit was conducted via telephone.  Chief Complaint: OBESITY Tonya Olsen is here to discuss her progress with her obesity treatment plan along with follow-up of her obesity related diagnoses. Tonya Olsen is on the Category 2 Plan and states she is following her eating plan approximately 85-90% of the time. Tonya Olsen states she is not exercising regularly at this time.  Today's visit was #: 2 Starting weight: 238 lbs Starting date: 05/10/2020  Interim History: [name] states that she has lost about 4 pounds.  She states that the plan is "hard".  Subjective:   1. Prediabetes Tonya Olsen has a diagnosis of prediabetes based on her elevated HgA1c and was informed this puts her at greater risk of developing diabetes. She continues to work on diet and exercise to decrease her risk of diabetes. She denies nausea or hypoglycemia.  Insulin level is 21.0.  No results found for: HGBA1C Lab Results  Component Value Date   INSULIN 21.0 05/10/2020   2. Vitamin D deficiency Tonya Olsen's Vitamin D level was 11.1 on 05/10/2020. She is currently taking no vitamin D supplement.  3. Elevated liver enzymes Tonya Olsen has a new dx of elevated ALT. Her BMI is over 40. She denies abdominal pain or jaundice and has never been told of any liver problems in the past. She denies excessive alcohol intake.  CT abdomen/pelvis in 12/18 showed liver with fatty infiltration.  Lab Results  Component  Value Date   ALT 106 (H) 05/10/2020   AST 65 (H) 05/10/2020   ALKPHOS 93 05/10/2020   BILITOT 0.7 05/10/2020   Assessment/Plan:   1. Prediabetes Tonya Olsen will continue to work on weight loss, exercise, and decreasing simple carbohydrates to help decrease the risk of diabetes.  Decrease carbohydrates and continue to follow plan.  2. Vitamin D deficiency Low Vitamin D level contributes to fatigue and are associated with obesity, breast, and colon cancer. She agrees to start to take prescription Vitamin D @50 ,000 IU every week and will follow-up for routine testing of Vitamin D, at least 2-3 times per year to avoid over-replacement.  3. Elevated liver enzymes We discussed the likely diagnosis of non-alcoholic fatty liver disease today and how this condition is obesity related. Meiko was educated the importance of weight loss. Tonya Olsen agreed to continue with her weight loss efforts with healthier diet and exercise as an essential part of her treatment plan.  Will watch for now, but may consider referral to GI.  4. Class 3 severe obesity with serious comorbidity and body mass index (BMI) of 40.0 to 44.9 in adult, unspecified obesity type (HCC)  [name] is currently in the action stage of change. As such, her goal is to continue with weight loss efforts. She has agreed to the Category 2 Plan.   She will work on meal planning and ideas for meals.  Will eat protein first.  Exercise goals: For substantial health benefits, adults should do at least 150 minutes (2 hours and 30 minutes) a week of moderate-intensity, or  75 minutes (1 hour and 15 minutes) a week of vigorous-intensity aerobic physical activity, or an equivalent combination of moderate- and vigorous-intensity aerobic activity. Aerobic activity should be performed in episodes of at least 10 minutes, and preferably, it should be spread throughout the week.  Behavioral modification strategies: increasing lean protein intake, decreasing simple  carbohydrates, increasing vegetables, increasing water intake, decreasing eating out, no skipping meals, meal planning and cooking strategies, keeping healthy foods in the home and planning for success.  Tonya Olsen has agreed to follow-up with our clinic in 2 weeks. She was informed of the importance of frequent follow-up visits to maximize her success with intensive lifestyle modifications for her multiple health conditions.  Objective:   VITALS: Per patient if applicable, see vitals. GENERAL: Alert and in no acute distress. CARDIOPULMONARY: No increased WOB. Speaking in clear sentences.  PSYCH: Pleasant and cooperative. Speech normal rate and rhythm. Affect is appropriate. Insight and judgement are appropriate. Attention is focused, linear, and appropriate.  NEURO: Oriented as arrived to appointment on time with no prompting.   Lab Results  Component Value Date   CREATININE 0.78 05/10/2020   BUN 10 05/10/2020   NA 138 05/10/2020   K 4.6 05/10/2020   CL 99 05/10/2020   CO2 25 05/10/2020   Lab Results  Component Value Date   ALT 106 (H) 05/10/2020   AST 65 (H) 05/10/2020   ALKPHOS 93 05/10/2020   BILITOT 0.7 05/10/2020   Lab Results  Component Value Date   INSULIN 21.0 05/10/2020   Lab Results  Component Value Date   WBC 11.9 (H) 04/13/2017   HGB 11.0 (L) 04/13/2017   HCT 33.3 (L) 04/13/2017   MCV 84.1 04/13/2017   PLT 297 04/13/2017   Attestation Statements:   Reviewed by clinician on day of visit: allergies, medications, problem list, medical history, surgical history, family history, social history, and previous encounter notes.  Time spent on visit including pre-visit chart review and post-visit charting and care was 30 minutes.   I, Water quality scientist, CMA, am acting as Location manager for [employer], DO  I have reviewed the above documentation for accuracy and completeness, and I agree with the above. Jearld Lesch, DO

## 2020-05-27 ENCOUNTER — Encounter (INDEPENDENT_AMBULATORY_CARE_PROVIDER_SITE_OTHER): Payer: Self-pay | Admitting: Bariatrics

## 2020-06-07 ENCOUNTER — Ambulatory Visit (INDEPENDENT_AMBULATORY_CARE_PROVIDER_SITE_OTHER): Payer: BC Managed Care – PPO | Admitting: Bariatrics

## 2020-06-07 ENCOUNTER — Encounter (INDEPENDENT_AMBULATORY_CARE_PROVIDER_SITE_OTHER): Payer: Self-pay | Admitting: Bariatrics

## 2020-06-07 ENCOUNTER — Other Ambulatory Visit: Payer: Self-pay

## 2020-06-07 VITALS — BP 107/76 | HR 89 | Temp 97.9°F | Ht 63.0 in | Wt 232.0 lb

## 2020-06-07 DIAGNOSIS — Z6841 Body Mass Index (BMI) 40.0 and over, adult: Secondary | ICD-10-CM

## 2020-06-07 DIAGNOSIS — R7989 Other specified abnormal findings of blood chemistry: Secondary | ICD-10-CM | POA: Diagnosis not present

## 2020-06-07 DIAGNOSIS — E559 Vitamin D deficiency, unspecified: Secondary | ICD-10-CM | POA: Diagnosis not present

## 2020-06-07 DIAGNOSIS — E66813 Obesity, class 3: Secondary | ICD-10-CM

## 2020-06-07 DIAGNOSIS — Z9189 Other specified personal risk factors, not elsewhere classified: Secondary | ICD-10-CM

## 2020-06-07 MED ORDER — VITAMIN D (ERGOCALCIFEROL) 1.25 MG (50000 UNIT) PO CAPS: 50000.0000 [IU] | ORAL_CAPSULE | ORAL | 0 refills | Status: AC

## 2020-06-07 NOTE — Progress Notes (Signed)
Chief Complaint:   OBESITY Tonya Olsen is here to discuss her progress with her obesity treatment plan along with follow-up of her obesity related diagnoses. Tonya Olsen is on the Category 2 Plan and states she is following her eating plan approximately 60% of the time. Tonya Olsen states she is not exercising regularly at this time.  Today's visit was #: 3 Starting weight: 238 lbs Starting date: 05/10/2020 Today's weight: 232 lbs Today's date: 06/07/2020 Total lbs lost to date: 6 lbs Total lbs lost since last in-office visit: 6 lbs  Interim History: Tonya Olsen is down 6 pounds since her last visit and is doing well overall.  She is drinking more water since she is unable to cook.  Subjective:   1. Vitamin D deficiency Tonya Olsen's Vitamin D level was 11.1 on 05/10/2020. She is currently taking no vitamin D supplement.   2. Elevated LFTs Tonya Olsen denies abdominal pain.  Lab Results  Component Value Date   ALT 106 (H) 05/10/2020   AST 65 (H) 05/10/2020   ALKPHOS 93 05/10/2020   BILITOT 0.7 05/10/2020   3. At risk for impaired function of liver Tonya Olsen is at risk for impaired function of liver due to likely diagnosis of fatty liver as evidenced by recent elevated liver enzymes.   Assessment/Plan:   1. Vitamin D deficiency Low Vitamin D level contributes to fatigue and are associated with obesity, breast, and colon cancer. She agrees to start to take prescription Vitamin D @50 ,000 IU every week and will follow-up for routine testing of Vitamin D, at least 2-3 times per year to avoid over-replacement.  - Start Vitamin D, Ergocalciferol, (DRISDOL) 1.25 MG (50000 UNIT) CAPS capsule; Take 1 capsule (50,000 Units total) by mouth every 7 (seven) days.  Dispense: 4 capsule; Refill: 0  2. Elevated LFTs We discussed the diagnosis of non-alcoholic fatty liver disease today and how this condition is obesity related. Tonya Olsen was educated the importance of weight loss. Tonya Olsen agreed to continue with her weight loss efforts with  healthier diet and exercise as an essential part of her treatment plan.  She will continue to work on diet and exercise.  3. At risk for impaired function of liver Tonya Olsen was given approximately 15 minutes of counseling today regarding prevention of impaired liver function. Tonya Olsen was educated about her risk of developing NASH or even liver failure and advised that the only proven treatment for NAFLD was weight loss of at least 5-10% of body weight.   4. Class 3 severe obesity with serious comorbidity and body mass index (BMI) of 40.0 to 44.9 in adult, unspecified obesity type (Tonya Olsen)  Tonya Olsen is currently in the action stage of change. As such, her goal is to continue with weight loss efforts. She has agreed to the Category 2 Plan.   She will work on meal planning, intentional eating, increasing her water intake to at least 64 ounces per day, and cooking more at home.  Exercise goals: For substantial health benefits, adults should do at least 150 minutes (2 hours and 30 minutes) a week of moderate-intensity, or 75 minutes (1 hour and 15 minutes) a week of vigorous-intensity aerobic physical activity, or an equivalent combination of moderate- and vigorous-intensity aerobic activity. Aerobic activity should be performed in episodes of at least 10 minutes, and preferably, it should be spread throughout the week.  Behavioral modification strategies: increasing lean protein intake, decreasing simple carbohydrates, increasing vegetables, increasing water intake, decreasing eating out, no skipping meals, meal planning and cooking strategies, keeping  healthy foods in the home and planning for success.  Tonya Olsen has agreed to follow-up with our clinic in 2 weeks. She was informed of the importance of frequent follow-up visits to maximize her success with intensive lifestyle modifications for her multiple health conditions.   Objective:   Blood pressure 107/76, pulse 89, temperature 97.9 F (36.6 C), height 5\' 3"  (1.6  m), weight 232 lb (105.2 kg), SpO2 96 %. Body mass index is 41.1 kg/m.  General: Cooperative, alert, well developed, in no acute distress. HEENT: Conjunctivae and lids unremarkable. Cardiovascular: Regular rhythm.  Lungs: Normal work of breathing. Neurologic: No focal deficits.   Lab Results  Component Value Date   CREATININE 0.78 05/10/2020   BUN 10 05/10/2020   NA 138 05/10/2020   K 4.6 05/10/2020   CL 99 05/10/2020   CO2 25 05/10/2020   Lab Results  Component Value Date   ALT 106 (H) 05/10/2020   AST 65 (H) 05/10/2020   ALKPHOS 93 05/10/2020   BILITOT 0.7 05/10/2020   Lab Results  Component Value Date   INSULIN 21.0 05/10/2020   Lab Results  Component Value Date   WBC 11.9 (H) 04/13/2017   HGB 11.0 (L) 04/13/2017   HCT 33.3 (L) 04/13/2017   MCV 84.1 04/13/2017   PLT 297 04/13/2017   Attestation Statements:   Reviewed by clinician on day of visit: allergies, medications, problem list, medical history, surgical history, family history, social history, and previous encounter notes.  I, Water quality scientist, CMA, am acting as Location manager for [employer], DO  I have reviewed the above documentation for accuracy and completeness, and I agree with the above. Jearld Lesch, DO

## 2020-06-24 ENCOUNTER — Ambulatory Visit (INDEPENDENT_AMBULATORY_CARE_PROVIDER_SITE_OTHER): Payer: BC Managed Care – PPO | Admitting: Bariatrics

## 2020-08-03 ENCOUNTER — Other Ambulatory Visit (INDEPENDENT_AMBULATORY_CARE_PROVIDER_SITE_OTHER): Payer: Self-pay | Admitting: Bariatrics

## 2020-08-03 DIAGNOSIS — E559 Vitamin D deficiency, unspecified: Secondary | ICD-10-CM

## 2020-08-04 NOTE — Telephone Encounter (Signed)
Dr.Brown 

## 2020-11-09 ENCOUNTER — Telehealth (INDEPENDENT_AMBULATORY_CARE_PROVIDER_SITE_OTHER): Payer: BC Managed Care – PPO | Admitting: Psychiatry

## 2020-11-09 ENCOUNTER — Encounter: Payer: Self-pay | Admitting: Psychiatry

## 2020-11-09 DIAGNOSIS — F32A Depression, unspecified: Secondary | ICD-10-CM | POA: Diagnosis not present

## 2020-11-09 MED ORDER — PAROXETINE HCL 20 MG PO TABS
20.0000 mg | ORAL_TABLET | Freq: Every day | ORAL | 2 refills | Status: DC
Start: 2020-11-09 — End: 2021-05-11

## 2020-11-09 NOTE — Progress Notes (Signed)
Tonya Olsen [id number] 04/07/1981 40 y.o.  Virtual Visit via Video Note  I connected with pt @ on 11/09/20 at  8:30 AM EDT by a video enabled telemedicine application and verified that I am speaking with the correct person using two identifiers.   I discussed the limitations of evaluation and management by telemedicine and the availability of in person appointments. The patient expressed understanding and agreed to proceed.  I discussed the assessment and treatment plan with the patient. The patient was provided an opportunity to ask questions and all were answered. The patient agreed with the plan and demonstrated an understanding of the instructions.   The patient was advised to call back or seek an in-person evaluation if the symptoms worsen or if the condition fails to improve as anticipated.  I provided 25 minutes of non-face-to-face time during this encounter.  The patient was located at home.  The provider was located at [place].   Thayer Headings, PMHNP   Subjective:   Patient ID:  Tonya Olsen is a 40 y.o. (DOB 15-Mar-1981) female.  Chief Complaint:  Chief Complaint  Patient presents with   Follow-up    Depression     HPI [name] presents for follow-up of anxiety and depression. She reports that that she has not had any recent anxiety. She reports, "for the most part, I have been doing great. I have been really stable." She denies any mood changes other than some mild mood changes around her menses. She reports that she is easier to cry and more irritable. She reports PMDD s/s are manageable. She denies persistent depression. She reports that she has a full range of affect. She reports that her sleep is slightly off during the summer due to having extra energy. She reports that her sleep is adequate. She reports that her energy has been good. She reports that her motivation is consistent with her baseline. Appetite has been normal. Concentration is  better compared to other medications. Denies anhedonia and enjoys family gatherings. Denies SI.   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   Past Psychiatric Medication Trials: Cymbalta- Worked well for a year and then started having night sweats and felt "in a fog" and had recurrence of depression and stopped it in Summer 2018. Paxil- Helpful with Depression. Has had sexual side effects. Has had night sweats. Sertraline- "brain fog" and felt slowed. Trintellix- Side effects Wellbutrin XL- Allergic reaction (rash) Lithium- Did not take since Depression improved. Xanax   Review of Systems:  Review of Systems  Eyes:  Negative for visual disturbance.  Gastrointestinal: Negative.   Musculoskeletal:  Negative for gait problem.  Skin:        Improved psoriasis with Skyrizi  Psychiatric/Behavioral:         Please refer to HPI   Medications: I have reviewed the patient's current medications.  Current Outpatient Medications  Medication Sig Dispense Refill   levonorgestrel (MIRENA, 52 MG,) 20 MCG/DAY IUD Mirena 20 mcg/24 hours (7 yrs) 52 mg intrauterine device  Take 1 device by intrauterine route.     SKYRIZI 150 MG/ML SOSY      PARoxetine (PAXIL) 20 MG tablet Take 1 tablet (20 mg total) by mouth daily. 90 tablet 2   Vitamin D, Ergocalciferol, (DRISDOL) 1.25 MG (50000 UNIT) CAPS capsule Take 1 capsule (50,000 Units total) by mouth every 7 (seven) days. 4 capsule 0   No current facility-administered medications for  this visit.    Medication Side Effects: Other: Sexual side effects, possible wt gain  Allergies:  Allergies  Allergen Reactions   Wellbutrin Xl [Bupropion]     Past Medical History:  Diagnosis Date   Anxiety    Back pain    Complication of anesthesia    TAKES A WHILE TO WAKE UP   Constipation    Depression    Fatty liver    Pre-diabetes    Psoriasis     Family History  Problem Relation Age of Onset   Diabetes Mother     High blood pressure Mother    High Cholesterol Mother    Obesity Mother    Diabetes Father    High blood pressure Father    Cancer Father    Depression Father    Obesity Father     Social History   Socioeconomic History   Marital status: Married    Spouse name: [name]   Number of children: Not on file   Years of education: Not on file   Highest education level: Not on file  Occupational History   Occupation: Metallurgist - [employer]  Tobacco Use   Smoking status: Never   Smokeless tobacco: Never  Substance and Sexual Activity   Alcohol use: No   Drug use: No   Sexual activity: Yes    Partners: Male    Birth control/protection: None    Comment: married  Other Topics Concern   Not on file  Social History Narrative   Not on file   Social Determinants of Health   Financial Resource Strain: Not on file  Food Insecurity: Not on file  Transportation Needs: Not on file  Physical Activity: Not on file  Stress: Not on file  Social Connections: Not on file  Intimate Partner Violence: Not on file    Past Medical History, Surgical history, Social history, and Family history were reviewed and updated as appropriate.   Please see review of systems for further details on the patient's review from today.   Objective:   Physical Exam:  There were no vitals taken for this visit.  Physical Exam Neurological:     Mental Status: She is alert and oriented to person, place, and time.     Cranial Nerves: No dysarthria.  Psychiatric:        Attention and Perception: Attention and perception normal.        Mood and Affect: Mood normal.        Speech: Speech normal.        Behavior: Behavior is cooperative.        Thought Content: Thought content normal. Thought content is not paranoid or delusional. Thought content does not include homicidal or suicidal ideation. Thought content does not include homicidal or suicidal plan.        Cognition and Memory: Cognition and  memory normal.        Judgment: Judgment normal.     Comments: Insight intact    Lab Review:     Component Value Date/Time   NA 138 05/10/2020 1432   K 4.6 05/10/2020 1432   CL 99 05/10/2020 1432   CO2 25 05/10/2020 1432   GLUCOSE 105 (H) 05/10/2020 1432   GLUCOSE 106 (H) 06/14/2015 1400   BUN 10 05/10/2020 1432   CREATININE 0.78 05/10/2020 1432   CREATININE 0.74 06/25/2011 1144   CALCIUM 9.6 05/10/2020 1432   PROT 7.8 05/10/2020 1432   ALBUMIN 4.7 05/10/2020 1432  AST 65 (H) 05/10/2020 1432   ALT 106 (H) 05/10/2020 1432   ALKPHOS 93 05/10/2020 1432   BILITOT 0.7 05/10/2020 1432   GFRNONAA 97 05/10/2020 1432   GFRAA 112 05/10/2020 1432       Component Value Date/Time   WBC 11.9 (H) 04/13/2017 1230   RBC 3.96 04/13/2017 1230   HGB 11.0 (L) 04/13/2017 1230   HCT 33.3 (L) 04/13/2017 1230   PLT 297 04/13/2017 1230   MCV 84.1 04/13/2017 1230   MCV 89.1 06/25/2011 1144   MCH 27.8 04/13/2017 1230   MCHC 33.0 04/13/2017 1230   RDW 12.9 04/13/2017 1230   LYMPHSABS 1.0 03/04/2010 0349   MONOABS 1.0 03/04/2010 0349   EOSABS 0.2 03/04/2010 0349   BASOSABS 0.0 03/04/2010 0349    No results found for: POCLITH, LITHIUM   No results found for: PHENYTOIN, PHENOBARB, VALPROATE, CBMZ   .res Assessment: Plan:   Continue Paxil 20 mg daily for mood and anxiety. She reports that benefits continue to outweigh side effects and reports, "my life is better on it."   Recommend Vitamin D 1,000 IU daily since she had a Vitamin D level of 11 on 05/10/20 and reports taking Vitamin D weekly for about 4 weeks without any additional supplementation. Discussed that provider could order Vitamin D level if needed. Plan is to re-start Vitamin D supplementation and re-check Vitamin D level in the future. Discussed that Vitamin D deficiency can contribute to fatigue and depression.   Pt to follow-up in 6 months or sooner if clinically indicated.  Patient advised to contact office with any questions,  adverse effects, or acute worsening in signs and symptoms.    Arcenia was seen today for follow-up.  Diagnoses and all orders for this visit:  Depression, unspecified depression type -     PARoxetine (PAXIL) 20 MG tablet; Take 1 tablet (20 mg total) by mouth daily.    Please see After Visit Summary for patient specific instructions.  No future appointments.   No orders of the defined types were placed in this encounter.     -------------------------------

## 2021-01-07 ENCOUNTER — Other Ambulatory Visit: Payer: Self-pay | Admitting: Obstetrics and Gynecology

## 2021-01-21 ENCOUNTER — Other Ambulatory Visit: Payer: Self-pay | Admitting: Obstetrics and Gynecology

## 2021-01-21 DIAGNOSIS — R1030 Lower abdominal pain, unspecified: Secondary | ICD-10-CM

## 2021-02-08 ENCOUNTER — Telehealth: Payer: Self-pay | Admitting: Psychiatry

## 2021-02-08 NOTE — Telephone Encounter (Signed)
Lvm to return call

## 2021-02-08 NOTE — Telephone Encounter (Signed)
Pt will call back to make an appt

## 2021-02-08 NOTE — Telephone Encounter (Signed)
Pt stated she is more sluggish/sleepy lately and more depressed.She wants to know if she should make an appt to possibly adjust meds.

## 2021-02-08 NOTE — Telephone Encounter (Signed)
Pt left a message stating that she thinks her medicine needs to be adjusted or changed. She doesn't know if she needs to make an appt to be seen. Please give her a call at [phone]

## 2021-02-08 NOTE — Telephone Encounter (Signed)
Yes, it would probably be best to schedule earlier apt to discuss medication options. Could try higher dose of Paxil to 30 mg in the meantime or stay at 20 mg.

## 2021-05-11 ENCOUNTER — Other Ambulatory Visit: Payer: Self-pay

## 2021-05-11 ENCOUNTER — Encounter: Payer: Self-pay | Admitting: Psychiatry

## 2021-05-11 ENCOUNTER — Ambulatory Visit: Payer: BC Managed Care – PPO | Admitting: Psychiatry

## 2021-05-11 DIAGNOSIS — F4010 Social phobia, unspecified: Secondary | ICD-10-CM | POA: Diagnosis not present

## 2021-05-11 DIAGNOSIS — F411 Generalized anxiety disorder: Secondary | ICD-10-CM

## 2021-05-11 DIAGNOSIS — F32A Depression, unspecified: Secondary | ICD-10-CM | POA: Diagnosis not present

## 2021-05-11 MED ORDER — PAROXETINE HCL 20 MG PO TABS: ORAL_TABLET | ORAL | 2 refills | Status: AC

## 2021-05-11 MED ORDER — VILAZODONE HCL 20 MG PO TABS: 20.0000 mg | ORAL_TABLET | Freq: Every day | ORAL | 1 refills | Status: AC

## 2021-05-11 MED ORDER — VILAZODONE HCL 10 MG PO TABS: ORAL_TABLET | ORAL | 0 refills | Status: DC

## 2021-05-11 MED ORDER — VILAZODONE HCL 10 MG PO TABS: ORAL_TABLET | ORAL | 0 refills | Status: AC

## 2021-05-11 NOTE — Progress Notes (Signed)
SECRET Tonya Olsen [id number] 06-May-1981 41 y.o.  Subjective:   Patient ID:  Tonya Olsen is a 41 y.o. (DOB 05-01-1981) female.  Chief Complaint:  Chief Complaint  Patient presents with   Anxiety   Depression    HPI Tonya Olsen presents to the office today for follow-up of anxiety and depression. She reports "stable with the day to day stuff." She reports that increase in Paxil to 30 mg po qd has been helpful for her depression, however she is now having severe sexual dysfunction and extreme fatigue to the point of falling asleep during the daytime despite adequate sleep. She reports that depression is improved "but still there."  She reports her husband commented that her "anxiety has gotten worse." They have noticed that she is anxious when she is going into social situations and crowds. She reports, "I don't go out unless I absolutely have to." Reports feeling "socially awkward" and "tense" in social situations. Has anxiety when there are more people, even when the group consists of close friends and family. Reports that she has had long term social anxiety and strongly disliked giving presentations. Headaches in response to anxiety. Denies panic attacks. She reports feeling jittery due to anxiety and reports "nervous tics." Denies obsessions or compulsions. 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 Reports that she is constantly focused on her to-do list. Denies nightmares or intrusive memories- "I think I have a lot of suppression." She reports that she is hyper-vigilant and has increased startle response.   She reports that she has some difficulty with sleep due to husband snoring and puppy needing to go out. Reports that she slept excessively during winter break for a week, ie. 18 hours in a 24 hour period. She reports low motivation and that she pushes through to meet responsibilities. Appetite is fine. She reports being easily distracted. She reports  diminished interests and enjoyment. Has not read recently. Denies SI.   She reports stressors. Reports that she has psoriasis that causes her stress.   Seeing a therapist, Gwyndolyn Saxon with [facility], once a week.   Past Psychiatric Medication Trials: Cymbalta- Worked well for a year and then started having night sweats and felt "in a fog" and had recurrence of depression and stopped it in Summer 2018. Paxil- Helpful with Depression. Has had sexual side effects. Has had night sweats. Severe fatigue and drowsiness with 30 mg.  Sertraline- "brain fog" and felt slowed. Trintellix- Side effects Wellbutrin XL- Allergic reaction (rash) Lithium- Did not take since Depression improved. Xanax  PHQ2-9    [facility] Office Visit from 05/10/2020 in [place]  PHQ-2 Total Score 6  PHQ-9 Total Score 23        Review of Systems:  Review of Systems  Musculoskeletal:  Negative for gait problem.  Skin:  Positive for rash.       Psoriasis  Neurological:  Positive for headaches. Negative for tremors.  Psychiatric/Behavioral:         Please refer to HPI   Medications: I have reviewed the patient's current medications.  Current Outpatient Medications  Medication Sig Dispense Refill   Cholecalciferol (VITAMIN D) 50 MCG (2000 UT) CAPS Take by mouth.     levonorgestrel (MIRENA, 52 MG,) 20 MCG/DAY IUD Mirena 20 mcg/24 hours (7 yrs) 52 mg intrauterine device  Take 1 device by intrauterine route.     SKYRIZI 150 MG/ML SOSY      Vilazodone HCl (VIIBRYD) 10 MG TABS  Take 1/2 tablet daily in the morning with food, then 1 tablet daily with breakfast for one week, then 2 tablets daily with breakfast 30 tablet 0   Vilazodone HCl 20 MG TABS Take 1 tablet (20 mg total) by mouth daily with breakfast. 30 tablet 1   PARoxetine (PAXIL) 20 MG tablet Take 1 tablet daily for 10 days, then decrease to 1/2 tablet daily for 10 days, then stop 90 tablet 2   Vitamin D, Ergocalciferol, (DRISDOL) 1.25 MG  (50000 UNIT) CAPS capsule Take 1 capsule (50,000 Units total) by mouth every 7 (seven) days. (Patient not taking: Reported on 05/11/2021) 4 capsule 0   No current facility-administered medications for this visit.    Medication Side Effects: None  Allergies:  Allergies  Allergen Reactions   Wellbutrin Xl [Bupropion]     Past Medical History:  Diagnosis Date   Anxiety    Back pain    Complication of anesthesia    TAKES A WHILE TO WAKE UP   Constipation    Depression    Fatty liver    Pre-diabetes    Psoriasis     Past Medical History, Surgical history, Social history, and Family history were reviewed and updated as appropriate.   Please see review of systems for further details on the patient's review from today.   Objective:   Physical Exam:  There were no vitals taken for this visit.  Physical Exam Constitutional:      General: She is not in acute distress. Musculoskeletal:        General: No deformity.  Neurological:     Mental Status: She is alert and oriented to person, place, and time.     Coordination: Coordination normal.  Psychiatric:        Attention and Perception: Attention and perception normal. She does not perceive auditory or visual hallucinations.        Mood and Affect: Mood is anxious. Affect is not labile, blunt, angry or inappropriate.        Speech: Speech normal.        Behavior: Behavior normal.        Thought Content: Thought content normal. Thought content is not paranoid or delusional. Thought content does not include homicidal or suicidal ideation. Thought content does not include homicidal or suicidal plan.        Cognition and Memory: Cognition and memory normal.        Judgment: Judgment normal.     Comments: Insight intact Dysthymic mood    Lab Review:     Component Value Date/Time   NA 138 05/10/2020 1432   K 4.6 05/10/2020 1432   CL 99 05/10/2020 1432   CO2 25 05/10/2020 1432   GLUCOSE 105 (H) 05/10/2020 1432   GLUCOSE 106  (H) 06/14/2015 1400   BUN 10 05/10/2020 1432   CREATININE 0.78 05/10/2020 1432   CREATININE 0.74 06/25/2011 1144   CALCIUM 9.6 05/10/2020 1432   PROT 7.8 05/10/2020 1432   ALBUMIN 4.7 05/10/2020 1432   AST 65 (H) 05/10/2020 1432   ALT 106 (H) 05/10/2020 1432   ALKPHOS 93 05/10/2020 1432   BILITOT 0.7 05/10/2020 1432   GFRNONAA 97 05/10/2020 1432   GFRAA 112 05/10/2020 1432       Component Value Date/Time   WBC 11.9 (H) 04/13/2017 1230   RBC 3.96 04/13/2017 1230   HGB 11.0 (L) 04/13/2017 1230   HCT 33.3 (L) 04/13/2017 1230   PLT 297 04/13/2017 1230   MCV  84.1 04/13/2017 1230   MCV 89.1 06/25/2011 1144   MCH 27.8 04/13/2017 1230   MCHC 33.0 04/13/2017 1230   RDW 12.9 04/13/2017 1230   LYMPHSABS 1.0 03/04/2010 0349   MONOABS 1.0 03/04/2010 0349   EOSABS 0.2 03/04/2010 0349   BASOSABS 0.0 03/04/2010 0349    No results found for: POCLITH, LITHIUM   No results found for: PHENYTOIN, PHENOBARB, VALPROATE, CBMZ   .res Assessment: Plan:   Pt seen for 30 minutes and time spent discussing concerns about side effects with higher dose of Paxil and possible treatment alternatives. Discussed potential benefits, risks, and side effects of Viibryd for anxiety and depression. Discussed that Viibryd is typically associated with low risk for sexual side effects, weight gain, affective dulling, and cognitive side effects. Discussed starting with low dose and gradually increasing to minimize risk of GI side effects. Discussed cross-titration of medications to minimize risk of discontinuation and possible side effects.  Decrease Paxil to 20 mg po qd for 10 days, then decrease to 10 mg po qd for 10 days, then stop. Start Vilazodone 10 mg 1/2 tablet daily in the morning with food for 5-7 days, then increase to 10 mg daily in the morning with food for 7 days, then increase to 20 mg daily.  Pt to follow-up in one month or sooner if clinically indicated. Patient advised to contact office with any  questions, adverse effects, or acute worsening in signs and symptoms.   Tonya Olsen was seen today for anxiety and depression.  Diagnoses and all orders for this visit:  Generalized anxiety disorder -     Vilazodone HCl (VIIBRYD) 10 MG TABS; Take 1/2 tablet daily in the morning with food, then 1 tablet daily with breakfast for one week, then 2 tablets daily with breakfast -     Vilazodone HCl 20 MG TABS; Take 1 tablet (20 mg total) by mouth daily with breakfast.  Depression, unspecified depression type -     PARoxetine (PAXIL) 20 MG tablet; Take 1 tablet daily for 10 days, then decrease to 1/2 tablet daily for 10 days, then stop -     Vilazodone HCl (VIIBRYD) 10 MG TABS; Take 1/2 tablet daily in the morning with food, then 1 tablet daily with breakfast for one week, then 2 tablets daily with breakfast -     Vilazodone HCl 20 MG TABS; Take 1 tablet (20 mg total) by mouth daily with breakfast.  Social anxiety disorder -     Vilazodone HCl (VIIBRYD) 10 MG TABS; Take 1/2 tablet daily in the morning with food, then 1 tablet daily with breakfast for one week, then 2 tablets daily with breakfast -     Vilazodone HCl 20 MG TABS; Take 1 tablet (20 mg total) by mouth daily with breakfast.     Please see After Visit Summary for patient specific instructions.  Future Appointments  Date Time Provider [place]  06/08/2021 11:00 AM Thayer Headings, PMHNP [facility] None    No orders of the defined types were placed in this encounter.   -------------------------------

## 2021-05-11 NOTE — Patient Instructions (Addendum)
Decrease Paxil to 20 mg po qd for 10 days, then decrease to 10 mg po qd for 10 days, then stop.  Start Vilazodone 10 mg 1/2 tablet daily in the morning with food for 5-7 days, then increase to 10 mg daily in the morning with food for 7 days, then increase to 20 mg daily.  A prescription has been sent for Viibryd 10 mg to start and there is a prescription on file at your pharmacy for the 20 mg tabs, so please call them when you are running low on the 10 mg.   Please call if you have any severe side effects or discontinuation.

## 2021-06-08 ENCOUNTER — Ambulatory Visit: Payer: BC Managed Care – PPO | Admitting: Psychiatry

## 2021-06-30 ENCOUNTER — Ambulatory Visit: Payer: BC Managed Care – PPO | Admitting: Psychiatry

## 2021-07-16 ENCOUNTER — Emergency Department (HOSPITAL_COMMUNITY)
Admission: EM | Admit: 2021-07-16 | Discharge: 2021-07-16 | Disposition: A | Discharge status: Home / Self Care | Payer: BC Managed Care – PPO | Attending: Emergency Medicine | Admitting: Emergency Medicine

## 2021-07-16 ENCOUNTER — Emergency Department (HOSPITAL_COMMUNITY): Payer: BC Managed Care – PPO

## 2021-07-16 ENCOUNTER — Encounter (HOSPITAL_COMMUNITY): Payer: Self-pay | Admitting: *Deleted

## 2021-07-16 ENCOUNTER — Other Ambulatory Visit: Payer: Self-pay

## 2021-07-16 DIAGNOSIS — R7401 Elevation of levels of liver transaminase levels: Secondary | ICD-10-CM | POA: Insufficient documentation

## 2021-07-16 DIAGNOSIS — M545 Low back pain, unspecified: Secondary | ICD-10-CM | POA: Insufficient documentation

## 2021-07-16 DIAGNOSIS — G8929 Other chronic pain: Secondary | ICD-10-CM | POA: Insufficient documentation

## 2021-07-16 DIAGNOSIS — R1084 Generalized abdominal pain: Secondary | ICD-10-CM | POA: Insufficient documentation

## 2021-07-16 DIAGNOSIS — R112 Nausea with vomiting, unspecified: Secondary | ICD-10-CM | POA: Insufficient documentation

## 2021-07-16 LAB — COMPREHENSIVE METABOLIC PANEL
ALT: 101 U/L — ABNORMAL HIGH (ref 0–44)
AST: 54 U/L — ABNORMAL HIGH (ref 15–41)
Albumin: 4 g/dL (ref 3.5–5.0)
Alkaline Phosphatase: 67 U/L (ref 38–126)
Anion gap: 10 (ref 5–15)
BUN: 9 mg/dL (ref 6–20)
CO2: 27 mmol/L (ref 22–32)
Calcium: 9.6 mg/dL (ref 8.9–10.3)
Chloride: 99 mmol/L (ref 98–111)
Creatinine, Ser: 0.75 mg/dL (ref 0.44–1.00)
GFR, Estimated: >60 mL/min (ref >60)
Glucose, Bld: 127 mg/dL — ABNORMAL HIGH (ref 70–99)
Potassium: 3.7 mmol/L (ref 3.5–5.1)
Sodium: 136 mmol/L (ref 135–145)
Total Bilirubin: 0.7 mg/dL (ref 0.3–1.2)
Total Protein: 7.4 g/dL (ref 6.5–8.1)

## 2021-07-16 LAB — URINALYSIS, ROUTINE W REFLEX MICROSCOPIC
Bacteria, UA: NONE SEEN
Bilirubin Urine: NEGATIVE
Glucose, UA: NEGATIVE mg/dL
Ketones, ur: NEGATIVE mg/dL
Leukocytes,Ua: NEGATIVE
Nitrite: NEGATIVE
Protein, ur: NEGATIVE mg/dL
Specific Gravity, Urine: 1.01 (ref 1.005–1.030)
pH: 5 (ref 5.0–8.0)

## 2021-07-16 LAB — CBC
HCT: 41.5 % (ref 36.0–46.0)
Hemoglobin: 14.3 g/dL (ref 12.0–15.0)
MCH: 28.9 pg (ref 26.0–34.0)
MCHC: 34.5 g/dL (ref 30.0–36.0)
MCV: 84 fL (ref 80.0–100.0)
Platelets: 235 10*3/uL (ref 150–400)
RBC: 4.94 MIL/uL (ref 3.87–5.11)
RDW: 12.2 % (ref 11.5–15.5)
WBC: 5.8 10*3/uL (ref 4.0–10.5)
nRBC: 0 % (ref 0.0–0.2)

## 2021-07-16 LAB — I-STAT BETA HCG BLOOD, ED (MC, WL, AP ONLY): I-stat hCG, quantitative: <5 m[IU]/mL (ref <5)

## 2021-07-16 LAB — LIPASE, BLOOD: Lipase: 29 U/L (ref 11–51)

## 2021-07-16 MED ORDER — DICYCLOMINE HCL 20 MG PO TABS: 20.0000 mg | ORAL_TABLET | Freq: Two times a day (BID) | ORAL | 0 refills | Status: AC

## 2021-07-16 MED ORDER — ONDANSETRON 4 MG PO TBDP: 4.0000 mg | ORAL_TABLET | Freq: Three times a day (TID) | ORAL | 0 refills | Status: AC | PRN

## 2021-07-16 MED ORDER — SODIUM CHLORIDE 0.9 % IV BOLUS
1000.0000 mL | Freq: Once | INTRAVENOUS | Status: AC
  Administered 2021-07-16: 1000 mL via INTRAVENOUS

## 2021-07-16 MED ORDER — IOHEXOL 300 MG/ML  SOLN
100.0000 mL | Freq: Once | INTRAMUSCULAR | Status: AC | PRN
  Administered 2021-07-16: 100 mL via INTRAVENOUS

## 2021-07-16 MED ORDER — ONDANSETRON HCL 4 MG/2ML IJ SOLN
4.0000 mg | Freq: Once | INTRAMUSCULAR | Status: AC
  Administered 2021-07-16: 4 mg via INTRAVENOUS
  Filled 2021-07-16: qty 2

## 2021-07-16 MED ORDER — MORPHINE SULFATE (PF) 4 MG/ML IV SOLN
4.0000 mg | Freq: Once | INTRAVENOUS | Status: AC
  Administered 2021-07-16: 4 mg via INTRAVENOUS
  Filled 2021-07-16: qty 1

## 2021-07-16 MED ORDER — OXYCODONE-ACETAMINOPHEN 5-325 MG PO TABS: 1.0000 | ORAL_TABLET | Freq: Four times a day (QID) | ORAL | 0 refills | Status: AC | PRN

## 2021-07-16 NOTE — ED Provider Notes (Signed)
[facility] EMERGENCY DEPARTMENT Provider Note   CSN: [id number] Arrival date & time: 07/16/21  1145     History  Chief Complaint  Patient presents with   Abdominal Pain    Tonya Olsen is a 41 y.o. female.  41 year old female presents today for evaluation of left lower quadrant abdominal pain.  Patient states her abdominal pain has been ongoing in the same location for several months but is significantly worsened last night to the point she had to apply pressure over the area to get any relief.  She denies fever, chills, lack of appetite, difficulty tolerating p.o. intake, diarrhea, constipation, vaginal discharge, vaginal bleeding, dysuria.  She denies any injury prior to onset of her abdominal pain.  She states initially was random and intermittent and resolved following rest however states recently is becoming increasingly frequent.  Patient also has chronic low back pain.  This is not changed.  She endorses some baseline nausea.  Had one episode of vomiting about a week and a half ago.  The history is provided by the patient. No language interpreter was used.      Home Medications Prior to Admission medications   Medication Sig Start Date End Date Taking? Authorizing Provider  Cholecalciferol (VITAMIN D) 50 MCG (2000 UT) CAPS Take by mouth.    [provider]  levonorgestrel (MIRENA, 52 MG,) 20 MCG/DAY IUD Mirena 20 mcg/24 hours (7 yrs) 52 mg intrauterine device  Take 1 device by intrauterine route.    [provider]  PARoxetine (PAXIL) 20 MG tablet Take 1 tablet daily for 10 days, then decrease to 1/2 tablet daily for 10 days, then stop 05/11/21   Thayer Headings, PMHNP  SKYRIZI 150 MG/ML SOSY  03/08/20   [provider]  Vilazodone HCl (VIIBRYD) 10 MG TABS Take 1/2 tablet daily in the morning with food for 5-7 days, then 1 tablet daily with breakfast for one week, then 2 tablets daily with breakfast 05/11/21   Thayer Headings, PMHNP   Vilazodone HCl 20 MG TABS Take 1 tablet (20 mg total) by mouth daily with breakfast. 05/11/21   Thayer Headings, PMHNP  Vitamin D, Ergocalciferol, (DRISDOL) 1.25 MG (50000 UNIT) CAPS capsule Take 1 capsule (50,000 Units total) by mouth every 7 (seven) days. Patient not taking: Reported on 05/11/2021 06/07/20   Jearld Lesch A, DO      Allergies    Wellbutrin xl [bupropion]    Review of Systems   Review of Systems  Constitutional:  Negative for activity change, chills and fever.  Respiratory:  Negative for shortness of breath.   Cardiovascular:  Negative for chest pain.  Gastrointestinal:  Positive for abdominal pain, nausea and vomiting. Negative for abdominal distention.  Genitourinary:  Negative for dysuria.  Neurological:  Negative for weakness and light-headedness.  All other systems reviewed and are negative.  Physical Exam Updated Vital Signs BP 120/82    Pulse 91    Temp (!) 97.5 F (36.4 C) (Oral)    Resp 15    Ht '5\' 2"'$  (1.575 m)    Wt 106.6 kg    LMP  (LMP Unknown) Comment: IUD   SpO2 100%    BMI 42.98 kg/m  Physical Exam Vitals and nursing note reviewed.  Constitutional:      General: She is not in acute distress.    Appearance: Normal appearance. She is not ill-appearing.  HENT:     Head: Normocephalic and atraumatic.     Nose: Nose normal.  Eyes:     General: No scleral icterus.    Extraocular Movements: Extraocular movements intact.     Conjunctiva/sclera: Conjunctivae normal.  Cardiovascular:     Rate and Rhythm: Normal rate and regular rhythm.     Pulses: Normal pulses.  Pulmonary:     Effort: Pulmonary effort is normal. No respiratory distress.     Breath sounds: Normal breath sounds. No wheezing or rales.  Abdominal:     General: There is no distension.     Palpations: Abdomen is soft.     Tenderness: There is abdominal tenderness. There is no right CVA tenderness, left CVA tenderness, guarding or rebound.  Musculoskeletal:        General: Normal range of  motion.     Cervical back: Normal range of motion.  Skin:    General: Skin is warm and dry.  Neurological:     General: No focal deficit present.     Mental Status: She is alert. Mental status is at baseline.    ED Results / Procedures / Treatments   Labs (all labs ordered are listed, but only abnormal results are displayed) Labs Reviewed  COMPREHENSIVE METABOLIC PANEL - Abnormal; Notable for the following components:      Result Value   Glucose, Bld 127 (*)    AST 54 (*)    ALT 101 (*)    All other components within normal limits  URINALYSIS, ROUTINE W REFLEX MICROSCOPIC - Abnormal; Notable for the following components:   Hgb urine dipstick MODERATE (*)    All other components within normal limits  LIPASE, BLOOD  CBC  I-STAT BETA HCG BLOOD, ED ([facility], [facility], [facility] ONLY)    EKG None  Radiology No results found.  Procedures Procedures    Medications Ordered in ED Medications  morphine (PF) 4 MG/ML injection 4 mg (4 mg Intravenous Given 07/16/21 1310)  ondansetron (ZOFRAN) injection 4 mg (4 mg Intravenous Given 07/16/21 1310)  sodium chloride 0.9 % bolus 1,000 mL (1,000 mLs Intravenous New Bag/Given 07/16/21 1309)    ED Course/ Medical Decision Making/ A&P                           Medical Decision Making Amount and/or Complexity of Data Reviewed Labs: ordered. Radiology: ordered.  Risk Prescription drug management.   Medical Decision Making / ED Course   This patient presents to the ED for concern of abdominal pain, this involves an extensive number of treatment options, and is a complaint that carries with it a high risk of complications and morbidity.  The differential diagnosis includes appendicitis, pancreatitis, diverticulitis, constipation, UTI, PID.   MDM: 41 year old female presents today for evaluation of abdominal pain located in the left lower quadrant.  This has been ongoing for several months however she had a severe episode last night.  She does have  history of ovarian torsion and she is status post left oophorectomy.  She is afebrile.  Vital signs are stable.  Does have diffuse abdominal tenderness but primarily in the left lower quadrant.  Will evaluate with CBC, CMP, UA, lipase, CT abdomen pelvis with contrast.  Will provide medication for symptomatic management and IV hydration. CBC without leukocytosis, or anemia.  UA without UTI.  Lipase within normal limits.  CMP with mildly elevated LFTs.  CT abdomen pelvis with hepatic steatosis otherwise without acute intra-abdominal process.  Patient with mild improvement in pain following pain medication.  Without evidence of any acute  intra-abdominal process as a result of her pain.  She has had her left ovary removed for ovarian torsion is not a concern.  Patient is stable for discharge.  Discharged in stable condition.  Return precautions discussed.  Gastroenterology referral provided.  Bentyl, Percocet, Zofran provided on discharge.   Lab Tests: -I ordered, reviewed, and interpreted labs.   The pertinent results include:   Labs Reviewed  COMPREHENSIVE METABOLIC PANEL - Abnormal; Notable for the following components:      Result Value   Glucose, Bld 127 (*)    AST 54 (*)    ALT 101 (*)    All other components within normal limits  URINALYSIS, ROUTINE W REFLEX MICROSCOPIC - Abnormal; Notable for the following components:   Hgb urine dipstick MODERATE (*)    All other components within normal limits  LIPASE, BLOOD  CBC  I-STAT BETA HCG BLOOD, ED ([facility], [facility], [facility] ONLY)      EKG  EKG Interpretation  Date/Time:    Ventricular Rate:    PR Interval:    QRS Duration:   QT Interval:    QTC Calculation:   R Axis:     Text Interpretation:           Imaging Studies ordered: I ordered imaging studies including CT abdomen pelvis with contrast I independently visualized and interpreted imaging. I agree with the radiologist interpretation   Medicines ordered and prescription drug  management: Meds ordered this encounter  Medications   morphine (PF) 4 MG/ML injection 4 mg   ondansetron (ZOFRAN) injection 4 mg   sodium chloride 0.9 % bolus 1,000 mL   iohexol (OMNIPAQUE) 300 MG/ML solution 100 mL    -I have reviewed the patients home medicines and have made adjustments as needed  Cardiac Monitoring: The patient was maintained on a cardiac monitor.  I personally viewed and interpreted the cardiac monitored which showed an underlying rhythm of: Initially sinus tachycardia improved to normal sinus rhythm  Reevaluation: After the interventions noted above, I reevaluated the patient and found that they have :improved  Co morbidities that complicate the patient evaluation  Past Medical History:  Diagnosis Date   Anxiety    Back pain    Complication of anesthesia    TAKES A WHILE TO WAKE UP   Constipation    Depression    Fatty liver    Pre-diabetes    Psoriasis       Dispostion: Patient is appropriate for discharge and follow-up with gastroenterology outpatient.  Return precautions discussed.    Final Clinical Impression(s) / ED Diagnoses Final diagnoses:  Generalized abdominal pain    Rx / DC Orders ED Discharge Orders          Ordered    ondansetron (ZOFRAN-ODT) 4 MG disintegrating tablet  Every 8 hours PRN        07/16/21 1450    oxyCODONE-acetaminophen (PERCOCET/ROXICET) 5-325 MG tablet  Every 6 hours PRN        07/16/21 1450    dicyclomine (BENTYL) 20 MG tablet  2 times daily        07/16/21 1450              Evlyn Courier, PA-C 07/16/21 1505    Lucrezia Starch, MD 07/17/21 [phone]

## 2021-07-16 NOTE — Discharge Instructions (Addendum)
Your blood work, CT scan were all reassuring and without a serious cause of your abdominal pain.  You received IV fluids and pain medication in the emergency room with some improvement in your pain.  I have given you a referral to gastroenterology.  Please call them Monday to schedule your appointment for follow-up.  If your symptoms worsen in the meantime please return to the emergency room.  I have prescribed Zofran, Bentyl, and a few doses of pain medication for you to keep on hand for severe or breakthrough pain. ?

## 2021-07-16 NOTE — ED Triage Notes (Addendum)
PT states abdominal pain for several months, that increased last night.  Pain is on the LLQ.  Also states nausea for several weeks.  Denies changes in bowel or bladder habits.  States pain feels similar to her ovarian torsion in 2018 (ovary was removed). ?

## 2021-12-14 ENCOUNTER — Encounter (INDEPENDENT_AMBULATORY_CARE_PROVIDER_SITE_OTHER): Payer: Self-pay

## 2023-03-21 ENCOUNTER — Encounter: Payer: Self-pay | Admitting: Psychiatry
